# Patient Record
Sex: Male | Born: 1975 | Race: White | Hispanic: No | Smoking: Current every day smoker
Health system: Southern US, Community
[De-identification: ages and names within clinical notes are randomized; demographics above are authoritative.]

## PROBLEM LIST (undated history)

## (undated) DIAGNOSIS — F329 Major depressive disorder, single episode, unspecified: Secondary | ICD-10-CM

## (undated) DIAGNOSIS — J449 Chronic obstructive pulmonary disease, unspecified: Secondary | ICD-10-CM

## (undated) DIAGNOSIS — I10 Essential (primary) hypertension: Secondary | ICD-10-CM

## (undated) DIAGNOSIS — F419 Anxiety disorder, unspecified: Secondary | ICD-10-CM

## (undated) DIAGNOSIS — F32A Depression, unspecified: Secondary | ICD-10-CM

## (undated) HISTORY — DX: Chronic obstructive pulmonary disease, unspecified: J44.9

## (undated) HISTORY — DX: Major depressive disorder, single episode, unspecified: F32.9

## (undated) HISTORY — PX: HIP ARTHROSCOPY: SUR88

## (undated) HISTORY — DX: Anxiety disorder, unspecified: F41.9

## (undated) HISTORY — DX: Depression, unspecified: F32.A

## (undated) HISTORY — DX: Essential (primary) hypertension: I10

---

## 1998-05-02 ENCOUNTER — Encounter: Payer: Self-pay | Admitting: Emergency Medicine

## 1998-05-02 ENCOUNTER — Emergency Department (HOSPITAL_COMMUNITY): Admission: EM | Admit: 1998-05-02 | Discharge: 1998-05-02 | Payer: Self-pay | Admitting: Emergency Medicine

## 2000-04-13 ENCOUNTER — Emergency Department (HOSPITAL_COMMUNITY): Admission: EM | Admit: 2000-04-13 | Discharge: 2000-04-13 | Payer: Self-pay

## 2000-05-19 ENCOUNTER — Emergency Department (HOSPITAL_COMMUNITY): Admission: EM | Admit: 2000-05-19 | Discharge: 2000-05-19 | Payer: Self-pay | Admitting: Emergency Medicine

## 2000-05-19 ENCOUNTER — Encounter: Payer: Self-pay | Admitting: Emergency Medicine

## 2002-06-08 ENCOUNTER — Encounter: Payer: Self-pay | Admitting: Orthopedic Surgery

## 2002-06-08 ENCOUNTER — Ambulatory Visit (HOSPITAL_COMMUNITY): Admission: RE | Admit: 2002-06-08 | Discharge: 2002-06-08 | Payer: Self-pay | Admitting: Orthopedic Surgery

## 2002-08-15 ENCOUNTER — Ambulatory Visit (HOSPITAL_COMMUNITY): Admission: RE | Admit: 2002-08-15 | Discharge: 2002-08-15 | Payer: Self-pay | Admitting: Orthopedic Surgery

## 2002-08-15 ENCOUNTER — Encounter: Payer: Self-pay | Admitting: Orthopedic Surgery

## 2002-09-15 ENCOUNTER — Encounter: Admission: RE | Admit: 2002-09-15 | Discharge: 2002-11-28 | Payer: Self-pay | Admitting: Orthopedic Surgery

## 2002-12-27 ENCOUNTER — Ambulatory Visit (HOSPITAL_COMMUNITY): Admission: RE | Admit: 2002-12-27 | Discharge: 2002-12-27 | Payer: Self-pay | Admitting: Orthopedic Surgery

## 2003-03-28 ENCOUNTER — Encounter: Admission: RE | Admit: 2003-03-28 | Discharge: 2003-05-03 | Payer: Self-pay | Admitting: Orthopedic Surgery

## 2003-05-29 ENCOUNTER — Emergency Department (HOSPITAL_COMMUNITY): Admission: EM | Admit: 2003-05-29 | Discharge: 2003-05-29 | Payer: Self-pay | Admitting: Family Medicine

## 2004-05-28 ENCOUNTER — Ambulatory Visit: Payer: Self-pay | Admitting: Internal Medicine

## 2004-10-20 ENCOUNTER — Ambulatory Visit: Payer: Self-pay | Admitting: Internal Medicine

## 2005-01-07 ENCOUNTER — Emergency Department (HOSPITAL_COMMUNITY): Admission: EM | Admit: 2005-01-07 | Discharge: 2005-01-07 | Payer: Self-pay | Admitting: Family Medicine

## 2005-03-04 ENCOUNTER — Ambulatory Visit: Payer: Self-pay | Admitting: Family Medicine

## 2005-03-16 ENCOUNTER — Ambulatory Visit: Payer: Self-pay | Admitting: Internal Medicine

## 2005-03-31 ENCOUNTER — Ambulatory Visit: Payer: Self-pay | Admitting: Internal Medicine

## 2006-01-12 ENCOUNTER — Ambulatory Visit: Payer: Self-pay | Admitting: Family Medicine

## 2006-02-06 ENCOUNTER — Emergency Department (HOSPITAL_COMMUNITY): Admission: EM | Admit: 2006-02-06 | Discharge: 2006-02-06 | Payer: Self-pay | Admitting: Emergency Medicine

## 2006-03-09 ENCOUNTER — Ambulatory Visit: Payer: Self-pay | Admitting: Internal Medicine

## 2006-03-09 LAB — CONVERTED CEMR LAB
ALT: 29 units/L (ref 0–40)
Albumin: 4 g/dL (ref 3.5–5.2)
Alkaline Phosphatase: 47 units/L (ref 39–117)
Basophils Absolute: 0.1 10*3/uL (ref 0.0–0.1)
CO2: 31 meq/L (ref 19–32)
Chloride: 111 meq/L (ref 96–112)
Creatinine, Ser: 1 mg/dL (ref 0.4–1.5)
Eosinophils Absolute: 0.2 10*3/uL (ref 0.0–0.6)
GFR calc Af Amer: 112 mL/min
GFR calc non Af Amer: 93 mL/min
Glucose, Bld: 81 mg/dL (ref 70–99)
Hemoglobin: 15.1 g/dL (ref 13.0–17.0)
Phosphorus: 2.9 mg/dL (ref 2.3–4.6)
Potassium: 4.2 meq/L (ref 3.5–5.1)
Sodium: 145 meq/L (ref 135–145)
TSH: 1.12 microintl units/mL (ref 0.35–5.50)
Total Bilirubin: 0.9 mg/dL (ref 0.3–1.2)

## 2006-09-16 ENCOUNTER — Ambulatory Visit: Payer: Self-pay | Admitting: Internal Medicine

## 2006-09-16 DIAGNOSIS — M7500 Adhesive capsulitis of unspecified shoulder: Secondary | ICD-10-CM | POA: Insufficient documentation

## 2006-09-16 DIAGNOSIS — F419 Anxiety disorder, unspecified: Secondary | ICD-10-CM | POA: Insufficient documentation

## 2006-09-23 ENCOUNTER — Encounter: Payer: Self-pay | Admitting: Internal Medicine

## 2006-11-08 ENCOUNTER — Telehealth (INDEPENDENT_AMBULATORY_CARE_PROVIDER_SITE_OTHER): Payer: Self-pay | Admitting: *Deleted

## 2006-12-22 ENCOUNTER — Telehealth (INDEPENDENT_AMBULATORY_CARE_PROVIDER_SITE_OTHER): Payer: Self-pay | Admitting: *Deleted

## 2006-12-22 ENCOUNTER — Telehealth: Payer: Self-pay | Admitting: Internal Medicine

## 2007-02-03 HISTORY — PX: JOINT REPLACEMENT: SHX530

## 2007-03-04 ENCOUNTER — Inpatient Hospital Stay (HOSPITAL_COMMUNITY): Admission: RE | Admit: 2007-03-04 | Discharge: 2007-03-07 | Payer: Self-pay | Admitting: Orthopedic Surgery

## 2007-03-07 ENCOUNTER — Encounter (INDEPENDENT_AMBULATORY_CARE_PROVIDER_SITE_OTHER): Payer: Self-pay | Admitting: Internal Medicine

## 2007-04-06 ENCOUNTER — Telehealth (INDEPENDENT_AMBULATORY_CARE_PROVIDER_SITE_OTHER): Payer: Self-pay | Admitting: *Deleted

## 2007-05-16 ENCOUNTER — Emergency Department (HOSPITAL_COMMUNITY): Admission: EM | Admit: 2007-05-16 | Discharge: 2007-05-16 | Payer: Self-pay | Admitting: Emergency Medicine

## 2007-05-23 ENCOUNTER — Encounter (INDEPENDENT_AMBULATORY_CARE_PROVIDER_SITE_OTHER): Payer: Self-pay | Admitting: Internal Medicine

## 2007-09-17 ENCOUNTER — Emergency Department (HOSPITAL_COMMUNITY): Admission: EM | Admit: 2007-09-17 | Discharge: 2007-09-17 | Payer: Self-pay | Admitting: Family Medicine

## 2007-10-19 ENCOUNTER — Emergency Department (HOSPITAL_COMMUNITY): Admission: EM | Admit: 2007-10-19 | Discharge: 2007-10-19 | Payer: Self-pay | Admitting: Emergency Medicine

## 2007-11-08 ENCOUNTER — Ambulatory Visit (HOSPITAL_COMMUNITY): Admission: RE | Admit: 2007-11-08 | Discharge: 2007-11-08 | Payer: Self-pay | Admitting: Orthopedic Surgery

## 2007-12-04 ENCOUNTER — Emergency Department (HOSPITAL_COMMUNITY): Admission: EM | Admit: 2007-12-04 | Discharge: 2007-12-05 | Payer: Self-pay | Admitting: Emergency Medicine

## 2007-12-08 ENCOUNTER — Encounter: Admission: RE | Admit: 2007-12-08 | Discharge: 2008-02-02 | Payer: Self-pay | Admitting: Orthopedic Surgery

## 2009-02-02 HISTORY — PX: OTHER SURGICAL HISTORY: SHX169

## 2009-04-15 ENCOUNTER — Emergency Department (HOSPITAL_COMMUNITY): Admission: EM | Admit: 2009-04-15 | Discharge: 2009-04-15 | Payer: Self-pay | Admitting: Emergency Medicine

## 2009-05-10 ENCOUNTER — Ambulatory Visit: Payer: Self-pay | Admitting: Internal Medicine

## 2009-05-30 ENCOUNTER — Encounter (INDEPENDENT_AMBULATORY_CARE_PROVIDER_SITE_OTHER): Payer: Self-pay | Admitting: *Deleted

## 2009-06-10 ENCOUNTER — Encounter: Payer: Self-pay | Admitting: Internal Medicine

## 2009-06-10 ENCOUNTER — Ambulatory Visit: Payer: Self-pay | Admitting: Internal Medicine

## 2009-06-12 LAB — CONVERTED CEMR LAB
Barbiturate Quant, Ur: NEGATIVE
Cocaine Metabolites: POSITIVE — AB
Methadone: NEGATIVE
Opiate Screen, Urine: NEGATIVE
Phencyclidine (PCP): NEGATIVE
Propoxyphene: NEGATIVE

## 2009-07-17 ENCOUNTER — Encounter (INDEPENDENT_AMBULATORY_CARE_PROVIDER_SITE_OTHER): Payer: Self-pay | Admitting: Family Medicine

## 2009-07-17 ENCOUNTER — Ambulatory Visit: Payer: Self-pay | Admitting: Internal Medicine

## 2009-07-17 LAB — CONVERTED CEMR LAB
HDL: 55 mg/dL (ref >39)
Hemoglobin: 16.7 g/dL (ref 13.0–17.0)
LDL Cholesterol: 157 mg/dL — ABNORMAL HIGH (ref 0–99)
Lymphs Abs: 2.4 10*3/uL (ref 0.7–4.0)
MCV: 92.6 fL (ref 78.0–100.0)
Monocytes Absolute: 0.4 10*3/uL (ref 0.1–1.0)
Platelets: 235 10*3/uL (ref 150–400)
RBC: 5.3 M/uL (ref 4.22–5.81)
RDW: 13.3 % (ref 11.5–15.5)
Total Bilirubin: 0.7 mg/dL (ref 0.3–1.2)
Total CHOL/HDL Ratio: 4.5
Total Protein: 7.7 g/dL (ref 6.0–8.3)
Triglycerides: 172 mg/dL — ABNORMAL HIGH (ref <150)

## 2009-10-09 ENCOUNTER — Ambulatory Visit: Payer: Self-pay | Admitting: Internal Medicine

## 2010-01-02 ENCOUNTER — Emergency Department (HOSPITAL_COMMUNITY)
Admission: EM | Admit: 2010-01-02 | Discharge: 2010-01-02 | Payer: Self-pay | Source: Home / Self Care | Admitting: Emergency Medicine

## 2010-02-12 ENCOUNTER — Encounter: Admission: RE | Admit: 2010-02-12 | Discharge: 2010-03-04 | Payer: Self-pay | Source: Home / Self Care

## 2010-03-03 ENCOUNTER — Emergency Department (HOSPITAL_COMMUNITY)
Admission: EM | Admit: 2010-03-03 | Discharge: 2010-03-03 | Payer: Self-pay | Source: Home / Self Care | Admitting: Family Medicine

## 2010-03-04 NOTE — Letter (Signed)
Summary: Generic Letter  [facility]  [address]   [place], [place] [address]   Phone: [phone]  Fax: [phone]       05/30/2009  [name] [address] [place], [place]  [address]  To Whom it may concern,  [name] has a history of chronic right hip pain; he had a total hip replacement 2009. He is currently taking pain medication to manage his pain. We plan to refer him to an Orthopedic Surgeon at [facility] for evaluation.              Sincerely,   Jessie Foot

## 2010-03-06 ENCOUNTER — Ambulatory Visit: Payer: Self-pay | Admitting: Physical Therapy

## 2010-03-10 ENCOUNTER — Ambulatory Visit: Payer: Self-pay | Attending: Internal Medicine

## 2010-03-10 DIAGNOSIS — IMO0001 Reserved for inherently not codable concepts without codable children: Secondary | ICD-10-CM | POA: Insufficient documentation

## 2010-03-10 DIAGNOSIS — M25659 Stiffness of unspecified hip, not elsewhere classified: Secondary | ICD-10-CM | POA: Insufficient documentation

## 2010-03-10 DIAGNOSIS — M25559 Pain in unspecified hip: Secondary | ICD-10-CM | POA: Insufficient documentation

## 2010-03-10 DIAGNOSIS — R5381 Other malaise: Secondary | ICD-10-CM | POA: Insufficient documentation

## 2010-03-12 ENCOUNTER — Ambulatory Visit: Payer: Self-pay

## 2010-06-02 ENCOUNTER — Inpatient Hospital Stay (INDEPENDENT_AMBULATORY_CARE_PROVIDER_SITE_OTHER)
Admission: RE | Admit: 2010-06-02 | Discharge: 2010-06-02 | Disposition: A | Discharge status: Home / Self Care | Payer: Self-pay | Source: Ambulatory Visit | Attending: Family Medicine | Admitting: Family Medicine

## 2010-06-02 ENCOUNTER — Ambulatory Visit (INDEPENDENT_AMBULATORY_CARE_PROVIDER_SITE_OTHER): Payer: Self-pay

## 2010-06-02 DIAGNOSIS — S7010XA Contusion of unspecified thigh, initial encounter: Secondary | ICD-10-CM

## 2010-06-17 NOTE — H&P (Signed)
[name], [name]              ACCOUNT NO.:  [id number]   MEDICAL RECORD NO.:  [id number]          PATIENT TYPE:  INP   LOCATION:  1603                         FACILITY:  [facility]   PHYSICIAN:  Ollen Gross, M.D.    DATE OF BIRTH:  [birth year]   DATE OF ADMISSION:  03/04/2007  DATE OF DISCHARGE:                              HISTORY & PHYSICAL   NOTE:  Date of office visit history and physical March 01, 2007.  Date  of admission March 04, 2007.   CHIEF COMPLAINT:  Right hip pain.   HISTORY OF PRESENT ILLNESS:  The patient is a 35 year old male well  known to Dr. Ollen Gross having been followed for quite some time now  for right hip pain.  He has had intermittent treatment for his right  hip.  He has undergone two hip arthroscopies in the past.  Unfortunately, he has gone on to develop some significant bone-on-bone  arthritis especially in the superior lateral area, continues to have  progressive worsening pain and dysfunction.  The risks and benefits have  been discussed.  He elects to proceed with surgery.   ALLERGIES:  No known drug allergies.   CURRENT MEDICATIONS:  Oxycodone.   PAST MEDICAL HISTORY:  Anxiety, history of bronchitis and arthritis.   PAST SURGICAL HISTORY:  Right hip arthroscopy x2.   FAMILY HISTORY:  Noncontributory.   SOCIAL HISTORY:  Married, an Horticulturist, commercial.  Smoking history one to  two packs a day for about 17 years.  Denies use of alcohol.  Four  children.   REVIEW OF SYSTEMS:  GENERAL:  No fevers, chills, night sweats.  NEUROLOGICAL:  No seizures, syncope or paralysis.  RESPIRATORY:  No  shortness breath, productive cough or hemoptysis.  CARDIOVASCULAR:  No  chest pain, angina or orthopnea.  GASTROINTESTINAL:  Does have some  heartburn reflux with constipation.  GU: No dysuria or hematuria.  MUSCULOSKELETAL:  Back pain and hip pain.   PHYSICAL EXAMINATION:  VITAL SIGNS:  Pulse 68, respirations 12, blood  pressure 110/72.  GENERAL:  A  35 year old white male well-nourished, well-developed, in no  acute distress, mildly anxious, alert and oriented and cooperative.  HEENT:  Normocephalic, atraumatic.  Pupils round and reactive.  Oropharynx clear.  EOMs intact.  NECK:  Supple.  CHEST:  Clear.  HEART:  Regular rate and rhythm with faint early systolic ejection  murmur best heard over the aortic point.  S1-S2 noted.  ABDOMEN:  Soft, nontender.  Bowel sounds present.  BREASTS/GENITALIA/RECTAL:  Not done, not pertinent to present illness.  EXTREMITIES:  Right hip flexion 90, zero internal rotation, 10-50  degrees external rotation, 20 degrees abduction.   IMPRESSION:  Osteoarthritis, right hip.   PLAN:  The patient was admitted to [facility] to undergo a  right total hip replacement arthroplasty.  Surgery will be performed by  Dr. Ollen Gross.      [name], P.A.C.      Ollen Gross, M.D.  Electronically Signed    ALP/MEDQ  D:  03/05/2007  T:  03/06/2007  Job:  [id number]   cc:  Billie D. Jillyn Hidden, FNP  [address] [place], [place] [address]   Ollen Gross, M.D.  Fax: [phone]

## 2010-06-17 NOTE — Op Note (Signed)
[name], [name]              ACCOUNT NO.:  [id number]   MEDICAL RECORD NO.:  [id number]          PATIENT TYPE:  INP   LOCATION:  0001                         FACILITY:  [facility]   PHYSICIAN:  Ollen Gross, M.D.    DATE OF BIRTH:  05-09-1975   DATE OF PROCEDURE:  03/04/2007  DATE OF DISCHARGE:                               OPERATIVE REPORT   PREOPERATIVE DIAGNOSIS:  Osteoarthritis right hip.   POSTOPERATIVE DIAGNOSIS:  Osteoarthritis right hip.   PROCEDURE:  Right total hip arthroplasty.   SURGEON:  Ollen Gross, M.D.   ASSISTANT:  Alexzandrew L. Perkins, P.A.C.   ANESTHESIA:  General.   ESTIMATED BLOOD LOSS:  Two hundred.   DRAINS:  Hemovac times one.   COMPLICATIONS:  None.   CONDITION:  Stable to recovery.   BRIEF CLINICAL NOTE:  [name] is a 35 year old male who has had  progressively worsening right hip pain and dysfunction.  He has had a  previous arthroscopy but over the past couple of years has gone on to  develop severe degenerative change in his hip.  He has had extensive  nonoperative management and presents now for total hip arthroplasty.   PROCEDURE IN DETAIL:  After the successful administration of general  anesthetic, the patient was placed in the left lateral decubitus  position with the right side up and held with the hip positioner.  Right  lower extremity was isolated from his perineum with plastic drapes and  prepped and draped in the usual sterile fashion.  Short posterolateral  incision was made with a 10 blade through subcutaneous tissue to the  level of the fascia lata which was incised in line with the skin  incision.  The sciatic nerve was palpated and protected and the short  external rotators isolated off the femur.  Capsulectomy is performed and  the hip is dislocated.  The center of the femoral head is marked and a  trial prosthesis placed such that the center of the trial head  corresponds to the center of his native femoral head.   Osteotomy lines  were marked on the femoral neck and osteotomy made with an oscillating  saw.  Femoral head was removed and the femur retracted anteriorly to  gain acetabular exposure.   The acetabular retractors were placed and labrum and osteophytes  removed.  Acetabular reaming starts at 45 mm with coursing increments of  2-53 mm and a 54 mm Pinnacle acetabular shell was placed in anatomic  position and transfixed with two dome screws.  The apex hole eliminator  was placed and the permanent 36 mm neutral Ultamet metal liner is  placed.   On the femoral side we prepared the canal with the canal finder and  irrigation.  Axial reaming was performed to 15.5 mm, proximal reaming to  a 20 B and the sleeve machined to a large 20 B large trial sleeve is  placed with a 20 x 15 stem and 36 plus 8 neck matching native  anteversion.  The 36 plus zero head is placed.  The hip was reduced with  excellent stability.  There was  full extension, full internal rotation,  70 degrees flexion, 40 degrees adduction, 90 degrees internal rotation,  90 degrees flexion and 70 degrees of internal rotation.  By placing the  right leg on top of the left it felt as though the leg lengths were  equal.  The hip was then dislocated and all trials removed.  The  permanent 20 B large sleeve is placed with a 20 x 15 stem, 36 plus 8  neck matching native anteversion.  The 36 plus zero head is placed.  The  hip was reduced with the same stability parameters.  The wound was  copiously irrigated with saline solution and the short rotators  reattached to the femur through drill holes.  Fascia lata was closed  over Hemovac drain with interrupted #1 Vicryl, subcu closed with #1 and  2-0 Vicryl and subcuticular with running 4-0 Monocryl.  The incisions  cleaned and dried and Steri-Strips applied.  Drains hooked to suction  and a bulky sterile dressing was applied.  He was then placed into a  knee immobilizer, awakened and  transported to recovery in stable  condition.      Ollen Gross, M.D.  Electronically Signed     FA/MEDQ  D:  03/04/2007  T:  03/04/2007  Job:  [id number]

## 2010-06-18 ENCOUNTER — Inpatient Hospital Stay (INDEPENDENT_AMBULATORY_CARE_PROVIDER_SITE_OTHER)
Admission: RE | Admit: 2010-06-18 | Discharge: 2010-06-18 | Disposition: A | Discharge status: Home / Self Care | Payer: Self-pay | Source: Ambulatory Visit | Attending: Family Medicine | Admitting: Family Medicine

## 2010-06-18 DIAGNOSIS — F410 Panic disorder [episodic paroxysmal anxiety] without agoraphobia: Secondary | ICD-10-CM

## 2010-06-20 NOTE — Discharge Summary (Signed)
[name], [name]              ACCOUNT NO.:  [id number]   MEDICAL RECORD NO.:  [id number]          PATIENT TYPE:  INP   LOCATION:  1603                         FACILITY:  [facility]   PHYSICIAN:  Ollen Gross, M.D.    DATE OF BIRTH:  December 11, 1975   DATE OF ADMISSION:  03/04/2007  DATE OF DISCHARGE:  03/07/2007                               DISCHARGE SUMMARY   ADMISSION DIAGNOSES:  1. Osteoarthritis right hip.  2. Anxiety.  3. History of bronchitis.   DISCHARGE DIAGNOSES:  1. Osteoarthritis right hip, status post right total hip arthroplasty.  2. Anxiety.  3. History of bronchitis.   PROCEDURE:  March 04, 2007, right total hip.  Surgeon:  Dr. Lequita Halt.  Assistant:  Greggory Brandy.   ANESTHESIA:  General.   CONSULTATIONS:  None.   BRIEF HISTORY:  [name] is a 35 year old male with progressive worsening  pain and dysfunction with the right hip.  He had a previous scope a  couple of years ago and had gone on to develop severe degenerative  changes, extensive nonoperative/postoperative management and now  presents for a total hip arthroplasty.   LABORATORY:  Preop CBC showed hemoglobin 15.0, hematocrit 41.5, white  cell count 6.0, platelets 222, postop hemoglobin 13.0 and drifted down.  Last H&H 11.9 and 33.3.  PT/PTT preop 12.0 and 32 respectively.  INR  0.9.  Serial pro-times followed.  PT/INR 21.6 and 1.8  Chem panel on  admission all within normal limits.  Serial BMETs are followed.  Electrolytes remained within normal limits.  Preop UA negative.  Blood  group type A+.   DIAGNOSTICS:  1. Two-view chest March 01, 2007:  No active cardiopulmonary      disease, mild chronic bronchitic markings.  2. Right hip films March 01, 2007:  Right total hip replacement      without complication.   HOSPITAL COURSE:  The patient admitted to [facility] and  tolerated the procedure well, later transferred to the recovery room and  orthopedic floor.  Started on PCA and p.o.  analgesic for pain control  following surgery.  He did have some pain noted postoperatively, but  otherwise doing pretty well.  Encouraged p.o. medications.  Drain placed  at the time of surgery, the Hemovac was pulled on day 1 without  difficulty.  Started getting up out of bed by day 2 and he was doing a  little bit better.  Hemoglobin was stable.  INR was coming up.  Started  working with physical therapy walking about 200 feet, progressing well  and by day 3 had been weaned over to p.o. meds and weaned off his PCA.  Tolerating his pain medications, meeting his goals and was discharged  home.   DISPOSITION:  The patient was discharged home on March 07, 2007.   DISCHARGE MEDICATIONS:  Percocet, Valium and Coumadin.   DIET:  As tolerated.   ACTIVITY:  Partial weightbearing 25-50% right lower extremity.  Home  health PT and home health nursing.  Total hip protocol and hip  precautions.   FOLLOW UP:  Follow up in 2 weeks.  CONDITION ON DISCHARGE:  Improved.      [name], P.A.C.      Ollen Gross, M.D.  Electronically Signed    ALP/MEDQ  D:  04/12/2007  T:  04/13/2007  Job:  [id number]   cc:   Ollen Gross, M.D.  Fax: [phone]   Billie D. Jillyn Hidden, FNP  [address] [place], [place] [address]

## 2010-06-20 NOTE — Assessment & Plan Note (Signed)
[facility]                                 ON-CALL NOTE   DIJUAN, SLEETH                     MRN:          [id number]  DATE:01/23/2006                            DOB:          1975-04-03    CALLER:  Patient's wife.   TELEPHONE NUMBER:  [phone].   He pulled something lifting something heavy on Tuesday at work. He  started having pain on Wednesday. He thought it would kind of get  better. He kept going to work and it has now gotten worse. He has  trouble lifting his arm and moving it around. He has not really tried  any medication. His wife has massaged him some, but that actually caused  some more pain. She thinks the pain is really like over the deltoid  muscle by the shoulder.   PLAN:  This does not seem like something serious or emergent. I did  recommend heat and ibuprofen. He has 800 mg prescription to ibuprofen to  try. I told her to have him take them 3x a day with meals. If he is not  getting better by Monday, I can take a look at it in the office and make  further decisions at that point.     Karie Schwalbe, MD  Electronically Signed    RIL/MedQ  DD: 01/23/2006  DT: 01/24/2006  Job #: [id number]

## 2010-06-20 NOTE — Op Note (Signed)
NAME:  Wagner, Henry                        ACCOUNT NO.:  [id number]   MEDICAL RECORD NO.:  [id number]                   PATIENT TYPE:  OIB   LOCATION:  2899                                 FACILITY:  [facility]   PHYSICIAN:  Ollen Gross, M.D.                 DATE OF BIRTH:  May 04, 1975   DATE OF PROCEDURE:  12/27/2002  DATE OF DISCHARGE:  12/27/2002                                 OPERATIVE REPORT   PREOPERATIVE DIAGNOSES:  Right hip recurrent labral tear.   POSTOPERATIVE DIAGNOSIS:  Right hip recurrent labral tear.   PROCEDURE:  Right hip arthroscopy and debridement.   SURGEON:  Ollen Gross, M.D.   ASSISTANT:  Alexzandrew L. Julien Girt, P.A.   ANESTHESIA:  General.   ESTIMATED BLOOD LOSS:  Minimal.   DRAINS:  None.   COMPLICATIONS:  None.   CONDITION:  Stable to recovery.   BRIEF CLINICAL NOTE:  [name] is a 35 year old male who had a right hip  arthroscopy in July 2004.  He had labral tear and chondral defect at that  time.  After physical therapy he had recovered relatively well, but then has  had recurrent pain and mechanical symptoms similar to his initial injury.  He presents now for repeat arthroscopy and debridement.   PROCEDURE IN DETAIL:  After the successful administration of general  anesthetic, the patient is placed in the left lateral decubitus position  with the right side up, with his lower extremities well padded.  A peroneal  post is placed and his right foot well padded and is placed into a traction  boot.  Longitudinal traction is applied under fluoroscopic guidance.  Again,  appropriate distraction to the hip joint.  We then prepped and draped the  hip in the usual sterile fashion.  A spinal needle was placed to the  anterior and posterior peritrochanteric portals, and found to enter the  joint.  Then 30 cc of saline is injected through the anterior portal, and  shown to come out through the posterior portal -- confirming that they are  both  intra-articular.  Incision is made posteriorly.  The Mittenhall wire is  passed and needle removed.  Portal was established with the dilator, and  then a 5 mm cannula placed.  Arthroscopic visualization proceeds.   The joint shows that there is site synovitis anteriorly.  The chondral  surface of the acetabulum looks intact, with no chondral defects.  Anteriorly the labrum is retorn at the anterior and inferior position.  The  femoral head does show some grade 2 and 3 chondromalacia centrally.  There  is no focal full-thickness chondral defect.  The wire was passed through the  anterior needle; incision made and dilator placed and the anterior portal  established.  A combination of a shaver and a Arthro-Care wand are used to  debride the labrum back to a stable place.  There is no associated chondral  defect.  The rest of the joint is again inspected.  There is no other  pathology noted.   At this point, the arthroscopic equipment is removed after injection of 20  cc of 0.5% Marcaine through the inflow cannula.  That cannula is removed and  the traction released.  Fluoroscopic shot shows concentric reduction of the  hip.  The portals are then closed with interrupted 4-0 nylon.  Bulky sterile  dressing is applied.   He was placed into hospital bed, awakened and transported to recovery room  in stable condition.   Of note, we were able to achieve full extension of his hip.  Preoperatively  he complained a flexor contracture, that has been present for several years.                                               Ollen Gross, M.D.    FA/MEDQ  D:  12/27/2002  T:  12/28/2002  Job:  [id number]

## 2010-06-20 NOTE — Op Note (Signed)
NAME:  [name], [name]                        ACCOUNT NO.:  [id number]   MEDICAL RECORD NO.:  [id number]                   PATIENT TYPE:  OIB   LOCATION:  NA                                   FACILITY:  [facility]   PHYSICIAN:  Ollen Gross, M.D.                 DATE OF BIRTH:  September 07, 1975   DATE OF PROCEDURE:  08/15/2002  DATE OF DISCHARGE:                                 OPERATIVE REPORT   PREOPERATIVE DIAGNOSIS:  Right hip labral tear.   POSTOPERATIVE DIAGNOSIS:  Right hip labral tear.   OPERATION PERFORMED:  Right hip arthroscopy, labral debridement and  chondroplasty.   SURGEON:  Ollen Gross, M.D.   ASSISTANT:  Alexzandrew L. Julien Girt, P.A.   ANESTHESIA:  General.   ESTIMATED BLOOD LOSS:  Minimal.   DRAINS:  None.   COMPLICATIONS:  None.   DISPOSITION:  Condition stable to recovery.   INDICATIONS FOR PROCEDURE:  Kaymen is a 35 year old male who has had a  several year history of progressively worsening right hip pain and  mechanical symptoms.  He has also had some low back pain.  Exam and history  area consistent with labral tear. He presents now for arthroscopy with  debridement.   DESCRIPTION OF PROCEDURE:  After the successful administration of general  anesthetic, the patient was placed in left lateral decubitus position and  his lower leg was well padded.  The perineal post in the lateral position  was placed and the right leg draped over it with the post well padded.  He  was placed in a well-padded traction boot and then under fluoroscopic  guidance, traction was applied until the joint was adequately distracted.  Once distracted, the hip was prepped and draped in the usual sterile  fashion. Spinal needle was used to localize the anterior and posterior  peritrochanteric portals.  Once established we injected saline through the  posterior portal and it came out through the anterior portal.  Nitinol wire  was placed through the posterior needle.  Needle removed  and incision made  and dilator was placed. A 5 mm cannula was placed and the camera passed into  the joint.  Once confirmed to be intra-articular, inflow was initiated.   Arthroscopic visualization shows the entire posterior and superior aspect of  the joint to appear normal.  Femoral head shows chondromalacia anteriorly  but no focal defects.  The anterior aspect of the acetabulum shows a large  anterior, inferior labral tear which goes from the 5 o'clock position to the  2 o'clock position. There was an associated anterior chondral defect.  The  rest of the anterior half looks normal.  The needle was found to be in good  position.  Thus the wire was passed and the anterior portal established.  We  then debrided the labral tear back to a stable base with the 4.2 shaver and  the ArthroCare device.  Chondroplasty  was then performed with the shaver and  arthro Gator.  Everything was found to be stable at this point.  The  arthroscopic equipment was then removed from the anterior portal and 20mL of  0.5% Marcaine with epinephrine injected through the inflow cannula.  Cannulas were removed and traction was taken off the joint and fluoroscopic  views confirmed the eccentric reduction.  We then closed the incisions with  interrupted 4-0 nylon, placed a bulky sterile dressing and the patient was  awakened and transported to recovery in stable condition.                                                Ollen Gross, M.D.    FA/MEDQ  D:  08/15/2002  T:  08/15/2002  Job:  [id number]

## 2010-06-26 ENCOUNTER — Emergency Department (HOSPITAL_COMMUNITY): Payer: Medicaid Other

## 2010-06-26 ENCOUNTER — Emergency Department (HOSPITAL_COMMUNITY)
Admission: EM | Admit: 2010-06-26 | Discharge: 2010-06-26 | Disposition: A | Discharge status: Home / Self Care | Payer: Medicaid Other | Attending: Emergency Medicine | Admitting: Emergency Medicine

## 2010-06-26 DIAGNOSIS — M79609 Pain in unspecified limb: Secondary | ICD-10-CM | POA: Insufficient documentation

## 2010-06-26 DIAGNOSIS — S92919A Unspecified fracture of unspecified toe(s), initial encounter for closed fracture: Secondary | ICD-10-CM | POA: Insufficient documentation

## 2010-06-26 DIAGNOSIS — M545 Low back pain, unspecified: Secondary | ICD-10-CM | POA: Insufficient documentation

## 2010-06-26 DIAGNOSIS — S8000XA Contusion of unspecified knee, initial encounter: Secondary | ICD-10-CM | POA: Insufficient documentation

## 2010-06-26 DIAGNOSIS — IMO0002 Reserved for concepts with insufficient information to code with codable children: Secondary | ICD-10-CM | POA: Insufficient documentation

## 2010-06-26 DIAGNOSIS — F411 Generalized anxiety disorder: Secondary | ICD-10-CM | POA: Insufficient documentation

## 2010-06-26 DIAGNOSIS — S63509A Unspecified sprain of unspecified wrist, initial encounter: Secondary | ICD-10-CM | POA: Insufficient documentation

## 2010-10-01 ENCOUNTER — Ambulatory Visit (INDEPENDENT_AMBULATORY_CARE_PROVIDER_SITE_OTHER): Payer: Self-pay

## 2010-10-01 ENCOUNTER — Inpatient Hospital Stay (INDEPENDENT_AMBULATORY_CARE_PROVIDER_SITE_OTHER)
Admission: RE | Admit: 2010-10-01 | Discharge: 2010-10-01 | Disposition: A | Discharge status: Home / Self Care | Payer: Self-pay | Source: Ambulatory Visit | Attending: Family Medicine | Admitting: Family Medicine

## 2010-10-01 DIAGNOSIS — S60459A Superficial foreign body of unspecified finger, initial encounter: Secondary | ICD-10-CM

## 2010-10-24 LAB — URINALYSIS, ROUTINE W REFLEX MICROSCOPIC
Ketones, ur: NEGATIVE
Nitrite: NEGATIVE

## 2010-10-24 LAB — CBC
HCT: 38.2 — ABNORMAL LOW
Hemoglobin: 13.7
Hemoglobin: 15
MCHC: 35.8
MCHC: 35.2
MCHC: 35.7
MCV: 88.8
Platelets: 210
Platelets: 222
Platelets: 199
Platelets: 224
RBC: 4.7
RBC: 4.15 — ABNORMAL LOW
RDW: 11.8
RDW: 11.9
RDW: 11.6
RDW: 12
WBC: 6.6

## 2010-10-24 LAB — COMPREHENSIVE METABOLIC PANEL
ALT: 25
AST: 20
Albumin: 4.2
Alkaline Phosphatase: 72
BUN: 6
CO2: 31
Calcium: 9.8
Chloride: 99
Creatinine, Ser: 0.87
GFR calc Af Amer: >60
GFR calc non Af Amer: >60
Glucose, Bld: 94
Potassium: 4.1
Sodium: 138
Total Bilirubin: 0.8
Total Protein: 7.1

## 2010-10-24 LAB — BASIC METABOLIC PANEL
BUN: 7
BUN: 4 — ABNORMAL LOW
CO2: 28
CO2: 28
Calcium: 8.9
Chloride: 102
Chloride: 102
Creatinine, Ser: 0.96
Creatinine, Ser: 0.83
GFR calc Af Amer: >60
GFR calc Af Amer: >60

## 2010-10-24 LAB — PROTIME-INR
INR: 0.9
INR: 1.6 — ABNORMAL HIGH
INR: 1.8 — ABNORMAL HIGH
Prothrombin Time: 12
Prothrombin Time: 19.5 — ABNORMAL HIGH
Prothrombin Time: 21.6 — ABNORMAL HIGH

## 2010-10-24 LAB — TYPE AND SCREEN
ABO/RH(D): A POS
Antibody Screen: NEGATIVE

## 2010-10-24 LAB — ABO/RH: ABO/RH(D): A POS

## 2011-02-01 ENCOUNTER — Emergency Department: Payer: Self-pay | Admitting: *Deleted

## 2012-01-21 ENCOUNTER — Encounter (HOSPITAL_COMMUNITY): Payer: Self-pay | Admitting: Emergency Medicine

## 2012-01-21 ENCOUNTER — Other Ambulatory Visit: Payer: Self-pay

## 2012-01-21 ENCOUNTER — Emergency Department (INDEPENDENT_AMBULATORY_CARE_PROVIDER_SITE_OTHER)
Admission: EM | Admit: 2012-01-21 | Discharge: 2012-01-21 | Disposition: A | Discharge status: Home / Self Care | Payer: Medicare Other | Source: Home / Self Care

## 2012-01-21 DIAGNOSIS — R0789 Other chest pain: Secondary | ICD-10-CM

## 2012-01-21 DIAGNOSIS — R079 Chest pain, unspecified: Secondary | ICD-10-CM

## 2012-01-21 DIAGNOSIS — R071 Chest pain on breathing: Secondary | ICD-10-CM

## 2012-01-21 DIAGNOSIS — R0781 Pleurodynia: Secondary | ICD-10-CM

## 2012-01-21 MED ORDER — KETOROLAC TROMETHAMINE 60 MG/2ML IM SOLN
INTRAMUSCULAR | Status: AC
  Filled 2012-01-21: qty 2

## 2012-01-21 MED ORDER — KETOROLAC TROMETHAMINE 60 MG/2ML IM SOLN
60.0000 mg | Freq: Once | INTRAMUSCULAR | Status: AC
  Administered 2012-01-21: 60 mg via INTRAMUSCULAR

## 2012-01-21 MED ORDER — NAPROXEN 500 MG PO TBEC: 500.0000 mg | DELAYED_RELEASE_TABLET | Freq: Two times a day (BID) | ORAL | Status: DC

## 2012-01-21 MED ORDER — HYDROCODONE-ACETAMINOPHEN 5-325 MG PO TABS: 1.0000 | ORAL_TABLET | ORAL | Status: DC | PRN

## 2012-01-21 NOTE — ED Provider Notes (Signed)
History     CSN: [id number]  Arrival date & time 01/21/12  1049   None     Chief Complaint  Patient presents with  . Chest Pain    (Consider location/radiation/quality/duration/timing/severity/associated sxs/prior treatment) HPI Comments: 36 year old male patient presents with right chest pain for 2 days. Just prior to the onset of pain he had been helping a friend move so he had been lifting boxes and furniture. He felt pain during that time it it has been progressively getting worse. It is worse with movement and certain positions and taking a deep breath. Pain is located in the right antero lateral ribs. He has no cough, fever or shortness of breath. He denies any trauma especially blunt trauma.   History reviewed. No pertinent past medical history.  No past surgical history on file.  No family history on file.  History  Substance Use Topics  . Smoking status: Not on file  . Smokeless tobacco: Not on file  . Alcohol Use: Not on file      Review of Systems  Respiratory: Negative.   Cardiovascular: Positive for chest pain.  Gastrointestinal: Negative.   Neurological: Negative.   All other systems reviewed and are negative.    Allergies  Fluoxetine hcl and Sertraline hcl  Home Medications   Current Outpatient Rx  Name  Route  Sig  Dispense  Refill  . HYDROCODONE-ACETAMINOPHEN 5-325 MG PO TABS   Oral   Take 1 tablet by mouth every 4 (four) hours as needed for pain.   15 tablet   0   . NAPROXEN 500 MG PO TBEC   Oral   Take 1 tablet (500 mg total) by mouth 2 (two) times daily with a meal.   20 tablet   0     BP 149/100  Pulse 79  Temp 98 F (36.7 C) (Oral)  Resp 18  SpO2 98%  Physical Exam  Constitutional: He is oriented to person, place, and time. He appears well-developed and well-nourished.  HENT:  Head: Normocephalic and atraumatic.  Eyes: EOM are normal. Left eye exhibits no discharge.  Neck: Normal range of motion. Neck supple.   Pulmonary/Chest: Effort normal. No respiratory distress. He has no wheezes. He has no rales.  Abdominal: Soft. He exhibits no distension. There is no tenderness. There is no rebound and no guarding.  Musculoskeletal:       Marked tenderness in the right anterolateral ribs just inferior to the pectoralis major muscle no crackling or immobility of ribs are appreciated. No shoulder pain or tenderness no pain or tenderness of the sternum.  Neurological: He is alert and oriented to person, place, and time. No cranial nerve deficit.  Skin: Skin is warm and dry.  Psychiatric: He has a normal mood and affect.    ED Course  Procedures (including critical care time)  Labs Reviewed - No data to display No results found.   1. Chest wall pain   2. Rib pain on right side       MDM  Patient is stable and having chest wall pain associated with marked tenderness and pain with movement. He has no cardiac history and no associated cardio pulmonary symptoms. Toradol 60 mg IM now Naprosy EC 500 mg twice a day when necessary Norco 5 mg one every 4 hours when necessary pain #15 Ice packs to sore areas for the next 23 days and may apply heat. Check probably for any new symptoms especially shortness of breath, cough or fever.  Hayden Rasmussen, NP 01/21/12 1421

## 2012-01-21 NOTE — ED Provider Notes (Signed)
Medical screening examination/treatment/procedure(s) were performed by non-physician practitioner and as supervising physician I was immediately available for consultation/collaboration.   MORENO-COLL,Haeleigh Streiff; MD   Dyland Panuco Moreno-Coll, MD 01/21/12 1512 

## 2012-01-21 NOTE — ED Notes (Signed)
Reports chest pain on right side by breast.  Patient states pain started Tuesday night but has gotten worst.  Patient has tried ibuprofen but no relief.  No radiation to any arm pain.

## 2012-02-08 ENCOUNTER — Ambulatory Visit (INDEPENDENT_AMBULATORY_CARE_PROVIDER_SITE_OTHER): Payer: Medicare Other | Admitting: Internal Medicine

## 2012-02-08 ENCOUNTER — Encounter: Payer: Self-pay | Admitting: Internal Medicine

## 2012-02-08 VITALS — BP 140/90 | HR 75 | Temp 98.4°F | Wt 190.0 lb

## 2012-02-08 DIAGNOSIS — F32A Depression, unspecified: Secondary | ICD-10-CM

## 2012-02-08 DIAGNOSIS — G894 Chronic pain syndrome: Secondary | ICD-10-CM

## 2012-02-08 DIAGNOSIS — R5381 Other malaise: Secondary | ICD-10-CM

## 2012-02-08 DIAGNOSIS — F329 Major depressive disorder, single episode, unspecified: Secondary | ICD-10-CM

## 2012-02-08 DIAGNOSIS — R5383 Other fatigue: Secondary | ICD-10-CM

## 2012-02-08 LAB — CBC WITH DIFFERENTIAL/PLATELET
Basophils Absolute: 0 10*3/uL (ref 0.0–0.1)
Basophils Relative: 0.5 % (ref 0.0–3.0)
Eosinophils Absolute: 0.2 10*3/uL (ref 0.0–0.7)
Hemoglobin: 16 g/dL (ref 13.0–17.0)
Lymphocytes Relative: 29.2 % (ref 12.0–46.0)
MCHC: 34.7 g/dL (ref 30.0–36.0)
Monocytes Relative: 6.7 % (ref 3.0–12.0)
Neutro Abs: 4.3 10*3/uL (ref 1.4–7.7)
Neutrophils Relative %: 60.9 % (ref 43.0–77.0)
RBC: 5.14 Mil/uL (ref 4.22–5.81)

## 2012-02-08 LAB — HEPATIC FUNCTION PANEL
ALT: 17 U/L (ref 0–53)
Alkaline Phosphatase: 73 U/L (ref 39–117)
Bilirubin, Direct: 0 mg/dL (ref 0.0–0.3)
Total Bilirubin: 0.7 mg/dL (ref 0.3–1.2)

## 2012-02-08 LAB — BASIC METABOLIC PANEL
CO2: 29 mEq/L (ref 19–32)
Calcium: 9.3 mg/dL (ref 8.4–10.5)
Creatinine, Ser: 1 mg/dL (ref 0.4–1.5)
GFR: 89.26 mL/min (ref >60.00)
Sodium: 139 mEq/L (ref 135–145)

## 2012-02-08 MED ORDER — CITALOPRAM HYDROBROMIDE 20 MG PO TABS: 20.0000 mg | ORAL_TABLET | Freq: Every day | ORAL | Status: DC

## 2012-02-08 NOTE — Patient Instructions (Signed)
Please call if you have any problems with the new medication 

## 2012-02-08 NOTE — Assessment & Plan Note (Signed)
Has had multiple leg surgeries and now with chronic pain Will continue the ibuprofen Refer for pain specialist

## 2012-02-08 NOTE — Progress Notes (Signed)
  Subjective:    Patient ID: [name], male    DOB: [birth year], 37 y.o.   MRN: [id number]  HPI Here with his mother  Discussed smoking He would like to quit---feels it would be difficult with all that is going on  Living with parents now Disabled for some years due to his hip problems  Ongoing pain from hip and femur No surgical options at this point--has reviewed this with Dr Mickle Plumb who reviewed it with his colleagues Has done PT--not really helpful Would consider vocational rehab but feels he can't do it due to severity of ongoing pain Is interested in pain referral Using just ibuprofen now-- 800mg  tid and occ 4 times per day Can't sleep due to the pain Multiple narcotics in past----oxycodone IR seemed to work the best   Review of Systems  Constitutional: Positive for fatigue. Negative for unexpected weight change.  HENT: Positive for sneezing. Negative for rhinorrhea.   Eyes: Positive for visual disturbance.       Vision is out of focus at times---wonders if it may be his blood pressure running high  Respiratory: Positive for cough and shortness of breath.        Some troubles since he has had the rib pain  Cardiovascular: Positive for chest pain and palpitations. Negative for leg swelling.       Occ sense of heart being irregular  Gastrointestinal: Negative for nausea, abdominal pain and constipation.       No heartburn  Genitourinary: Negative for urgency and difficulty urinating.  Musculoskeletal: Positive for back pain and arthralgias.       Right rib pain---reviewed his urgent care note  Skin: Negative for rash.  Neurological: Positive for dizziness, tremors and headaches. Negative for syncope.       Gets shaky at times--- better if he eats something  Hematological: Positive for adenopathy.       Cervical nodes in past  Psychiatric/Behavioral: Positive for sleep disturbance and dysphoric mood. The patient is nervous/anxious.        Objective:   Physical  Exam  Constitutional: He appears well-developed and well-nourished.       Clearly in pain Trouble moving around due to pain Guards his right ribs  HENT:  Mouth/Throat: Oropharynx is clear and moist. No oropharyngeal exudate.  Neck: Normal range of motion. No thyromegaly present.  Cardiovascular: Normal rate, regular rhythm, normal heart sounds and intact distal pulses.  Exam reveals no gallop.   No murmur heard. Pulmonary/Chest: Effort normal and breath sounds normal. No respiratory distress. He has no wheezes. He has no rales.  Abdominal: Soft. There is no tenderness.  Musculoskeletal: He exhibits no edema.  Lymphadenopathy:    He has no cervical adenopathy.  Psychiatric:       Depressed Appropriate affect No thought process disturbances          Assessment & Plan:

## 2012-02-08 NOTE — Assessment & Plan Note (Signed)
Non specific and most likely from the pain and depression Will check labs

## 2012-02-08 NOTE — Assessment & Plan Note (Signed)
Seems to have a secondary process from the pain Still might benefit from antidepressant Will start citalopram

## 2012-02-09 ENCOUNTER — Encounter: Payer: Self-pay | Admitting: *Deleted

## 2012-02-10 NOTE — Progress Notes (Signed)
  Subjective:    Patient ID: [name], male    DOB: [birth year], 37 y.o.   MRN: [id number]  HPI Current Outpatient Prescriptions on File Prior to Visit  Medication Sig Dispense Refill  . HYDROcodone-acetaminophen (NORCO/VICODIN) 5-325 MG per tablet Take 1 tablet by mouth every 4 (four) hours as needed for pain.  15 tablet  0  . citalopram (CELEXA) 20 MG tablet Take 1 tablet (20 mg total) by mouth daily.  30 tablet  5    Allergies  Allergen Reactions  . Fluoxetine Hcl     REACTION: paranoia  . Sertraline Hcl     REACTION: didn`t tolerate    Past Medical History  Diagnosis Date  . Anxiety   . Depression     Past Surgical History  Procedure Date  . Hip arthroscopy [id number]    right hip  . Joint replacement 2009    Right THR-- [name]  . Femur bone graft 2011    Cadaver-- Dr Mickle Plumb at [facility] History  Problem Relation Age of Onset  . Heart disease Father   . Birth defects Other   . Heart disease Other   . Diabetes Other   . Hypertension Other     History   Social History  . Marital Status: Legally Separated    Spouse Name: N/A    Number of Children: 2  . Years of Education: N/A   Occupational History  . Disabled since 2012     from hip   Social History Main Topics  . Smoking status: Current Every Day Smoker  . Smokeless tobacco: Never Used  . Alcohol Use: Not on file  . Drug Use: Not on file  . Sexually Active: Not on file   Other Topics Concern  . Not on file   Social History Narrative   Legally seperated since ~[address]92 children with their mother      Review of Systems     Objective:   Physical Exam        Assessment & Plan:

## 2012-02-11 ENCOUNTER — Encounter: Payer: Self-pay | Admitting: Physical Medicine & Rehabilitation

## 2012-03-03 ENCOUNTER — Encounter: Payer: Self-pay | Admitting: Physical Medicine & Rehabilitation

## 2012-03-03 ENCOUNTER — Ambulatory Visit (HOSPITAL_BASED_OUTPATIENT_CLINIC_OR_DEPARTMENT_OTHER): Payer: Medicare Other | Admitting: Physical Medicine & Rehabilitation

## 2012-03-03 ENCOUNTER — Encounter: Payer: Medicare Other | Attending: Physical Medicine & Rehabilitation

## 2012-03-03 VITALS — BP 150/98 | HR 60 | Resp 14 | Ht 69.0 in | Wt 186.0 lb

## 2012-03-03 DIAGNOSIS — R209 Unspecified disturbances of skin sensation: Secondary | ICD-10-CM | POA: Diagnosis not present

## 2012-03-03 DIAGNOSIS — G8928 Other chronic postprocedural pain: Secondary | ICD-10-CM | POA: Diagnosis not present

## 2012-03-03 DIAGNOSIS — G5711 Meralgia paresthetica, right lower limb: Secondary | ICD-10-CM | POA: Insufficient documentation

## 2012-03-03 DIAGNOSIS — M25559 Pain in unspecified hip: Secondary | ICD-10-CM | POA: Insufficient documentation

## 2012-03-03 DIAGNOSIS — G5710 Meralgia paresthetica, unspecified lower limb: Secondary | ICD-10-CM

## 2012-03-03 DIAGNOSIS — Z96649 Presence of unspecified artificial hip joint: Secondary | ICD-10-CM | POA: Diagnosis not present

## 2012-03-03 DIAGNOSIS — Z5181 Encounter for therapeutic drug level monitoring: Secondary | ICD-10-CM

## 2012-03-03 DIAGNOSIS — R52 Pain, unspecified: Secondary | ICD-10-CM

## 2012-03-03 MED ORDER — GABAPENTIN 100 MG PO CAPS: 100.0000 mg | ORAL_CAPSULE | Freq: Three times a day (TID) | ORAL | Status: DC

## 2012-03-03 NOTE — Patient Instructions (Signed)
Recommend aquatic exercise Will start gabapentin which is I nerve pain medicine 100 mg 3 times a day. We may have to go up to 300 mg or higher I will see you back in 2 weeks and decide if we need to change her medicine

## 2012-03-03 NOTE — Progress Notes (Signed)
Subjective:    Patient ID: [name], male    DOB: 1975-09-22, 37 y.o.   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 Postoperatively he had continued pain. Continue workup revealed distal femoral component tip causing lateral hip pain. [facility] on 11/09/2009. Postoperatively continued to have pain. Was treated with narcotic analgesic such as oxycodone.Pain is anterior distal thigh as well as lateral distal thigh.  Intermittent tingling as well.  Sometimes uses crutches.  He is currently on disability Had physical therapy after the hip replacement as well as the allograft placement Last x-rays were performed at [facility] orthopedic department approximately 6 or 7 months ago Had some burning pain that was severe which improved with Neurontin. Pain is worse with standing than with sitting. His back is starting to get sore because he sitting a lot                   Pain Inventory Average Pain 7 Pain Right Now 7 My pain is sharp, burning, stabbing and aching  In the last 24 hours, has pain interfered with the following? General activity 2 Relation with others 0 Enjoyment of life 0 What TIME of day is your pain at its worst? all the time Sleep (in general) Poor  Pain is worse with: walking, bending, sitting, inactivity and standing Pain improves with: medication Relief from Meds: 4  Mobility walk without assistance walk with assistance ability to climb steps?  no do you drive?  no  Function disabled: date disabled 2012 I need assistance with the following:  household duties and shopping  Neuro/Psych weakness numbness tingling trouble walking spasms anxiety  Prior Studies x-rays  Physicians involved in your care Primary care Dr Alphonsus Sias   Family History  Problem Relation Age of Onset  . Heart disease Father    . Birth defects Other   . Heart disease Other   . Diabetes Other   . Hypertension Other    History   Social History  . Marital Status: Legally Separated    Spouse Name: N/A    Number of Children: 2  . Years of Education: N/A   Occupational History  . Disabled since 2012     from hip   Social History Main Topics  . Smoking status: Current Every Day Smoker  . Smokeless tobacco: Never Used  . Alcohol Use: None  . Drug Use: None  . Sexually Active: None   Other Topics Concern  . None   Social History Narrative   Legally seperated since ~20092 children with their mother   Past Surgical History  Procedure Date  . Hip arthroscopy [id number]    right hip  . Joint replacement 2009    Right THR-- [name]  . Femur bone graft 2011    Cadaver-- Dr Mickle [facility]umb at [facility]   Past Medical History  Diagnosis Date  . Anxiety   . Depression    BP 150/98  Pulse 60  Resp 14  Ht 5\' 9"  (1.753 m)  Wt 186 lb (84.369 kg)  BMI 27.47 kg/m2  SpO2 97%     Review of Systems  Constitutional: Positive for diaphoresis and unexpected weight change.  Musculoskeletal: Positive for back pain and gait problem.  Neurological: Positive for weakness and numbness.  Psychiatric/Behavioral: The patient is nervous/anxious.   All other systems reviewed and are negative.  Objective:   Physical Exam  Constitutional: He is oriented to person, place, and time.  Musculoskeletal:       Right hip: He exhibits decreased strength. He exhibits no tenderness and no swelling.       Left hip: Normal.       Right knee: Normal.       Left knee: He exhibits deformity.       Right ankle: Normal.       Left ankle: Normal.       Lumbar back: Normal.       Well healed right lateral thigh incision no hypertrophic scar no hypersensitivity to touch No dys vascular changes  Prominent left patella tendon  Neurological: He is alert and oriented to person, place, and time. He has normal strength and normal  reflexes. He displays atrophy. A sensory deficit is present. Gait abnormal.       Mild right thigh atrophy Antalgic gait favors right leg, no evidence of toe drag or knee instability    Right thigh circumference 10 cm above superior patellar. 42 cm Left thigh circumference 10 cm above superior patellar 43 cm Sensation is mildly reduced to pinprick in the right anterior thigh and severely reduced to pinprick in the right lateral thigh. Normal sensation in the left side      Assessment & Plan:  1. 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 The pain characteristics and examination are most consistent with neuropathic pain.The right lateral femoral cutaneous nerve has the most dense sensory loss. The anterior thigh numbness is more subtle and represents the femoral nerve distribution. He has had relief with gabapentin in the past and we will start this today 100 mg 3 times a day. We may need to titrate upward. Other potential medications would include Lyrica or nortriptyline. If the patient does not get sufficient relief from oral medications he may benefit from nerve block. I would first perform an EMG prior to any type of nerve block to help document nerve injury. Discussed with patient agrees with plan Return to clinic 2 weeks. I do not think narcotic analgesics will be part of treatment plan

## 2012-03-15 ENCOUNTER — Ambulatory Visit: Payer: Medicare Other | Admitting: Internal Medicine

## 2012-03-17 ENCOUNTER — Ambulatory Visit: Payer: Medicare Other | Admitting: Internal Medicine

## 2012-03-18 ENCOUNTER — Ambulatory Visit: Payer: Medicare Other | Admitting: Physical Medicine & Rehabilitation

## 2012-03-22 ENCOUNTER — Encounter: Payer: Medicare Other | Attending: Physical Medicine & Rehabilitation

## 2012-03-22 ENCOUNTER — Encounter: Payer: Self-pay | Admitting: Physical Medicine & Rehabilitation

## 2012-03-22 ENCOUNTER — Ambulatory Visit (HOSPITAL_BASED_OUTPATIENT_CLINIC_OR_DEPARTMENT_OTHER): Payer: Medicare Other | Admitting: Physical Medicine & Rehabilitation

## 2012-03-22 VITALS — BP 132/90 | HR 56 | Resp 14 | Ht 69.0 in | Wt 191.0 lb

## 2012-03-22 DIAGNOSIS — G5710 Meralgia paresthetica, unspecified lower limb: Secondary | ICD-10-CM

## 2012-03-22 DIAGNOSIS — M25559 Pain in unspecified hip: Secondary | ICD-10-CM | POA: Insufficient documentation

## 2012-03-22 DIAGNOSIS — R209 Unspecified disturbances of skin sensation: Secondary | ICD-10-CM | POA: Diagnosis not present

## 2012-03-22 DIAGNOSIS — G8928 Other chronic postprocedural pain: Secondary | ICD-10-CM

## 2012-03-22 DIAGNOSIS — Z96649 Presence of unspecified artificial hip joint: Secondary | ICD-10-CM | POA: Diagnosis not present

## 2012-03-22 DIAGNOSIS — G5711 Meralgia paresthetica, right lower limb: Secondary | ICD-10-CM

## 2012-03-22 MED ORDER — GABAPENTIN 300 MG PO CAPS: 300.0000 mg | ORAL_CAPSULE | Freq: Every day | ORAL | Status: DC

## 2012-03-22 MED ORDER — GABAPENTIN 100 MG PO CAPS: 100.0000 mg | ORAL_CAPSULE | Freq: Three times a day (TID) | ORAL | Status: DC

## 2012-03-22 NOTE — Progress Notes (Signed)
Subjective:    Patient ID: [name], male    DOB: [birth year], 37 y.o.   MRN: [id number]  HPI Burning pain in the leg less intense on gabapentin Pain Inventory Average Pain 6 Pain Right Now 7 My pain is constant, sharp, burning, dull, stabbing, tingling and aching  In the last 24 hours, has pain interfered with the following? General activity 8 Relation with others 8 Enjoyment of life 8 What TIME of day is your pain at its worst? morning Sleep (in general) Fair  Pain is worse with: walking, bending, standing and some activites Pain improves with: medication Relief from Meds: 4  Mobility walk without assistance how many minutes can you walk? 10 ability to climb steps?  yes do you drive?  no Do you have any goals in this area?  yes  Function disabled: date disabled 2009  Neuro/Psych numbness tingling trouble walking spasms depression anxiety  Prior Studies Any changes since last visit?  no  Physicians involved in your care Any changes since last visit?  no   Family History  Problem Relation Age of Onset  . Heart disease Father   . Birth defects Other   . Heart disease Other   . Diabetes Other   . Hypertension Other    History   Social History  . Marital Status: Legally Separated    Spouse Name: N/A    Number of Children: 2  . Years of Education: N/A   Occupational History  . Disabled since 2012     from hip   Social History Main Topics  . Smoking status: Current Every Day Smoker  . Smokeless tobacco: Never Used  . Alcohol Use: None  . Drug Use: None  . Sexually Active: None   Other Topics Concern  . None   Social History Narrative   Legally seperated since ~2009   2 children with their mother   Past Surgical History  Procedure Laterality Date  . Hip arthroscopy  [id number]    right hip  . Joint replacement  2009    Right THR-- [name]  . Femur bone graft  2011    Cadaver-- Dr Mickle Plumb at [facility]   Past Medical History   Diagnosis Date  . Anxiety   . Depression    BP 132/90  Pulse 56  Resp 14  Ht 5\' 9"  (1.753 m)  Wt 191 lb (86.637 kg)  BMI 28.19 kg/m2  SpO2 98%      Review of Systems  Constitutional: Positive for diaphoresis.  Musculoskeletal: Positive for gait problem.  Neurological: Positive for numbness.  Psychiatric/Behavioral: Positive for dysphoric mood. The patient is nervous/anxious.   All other systems reviewed and are negative.       Objective:   Physical Exam  Constitutional: He is oriented to person, place, and time.  Musculoskeletal:  Right hip: He exhibits decreased strength. He exhibits no tenderness and no swelling.  Left hip: Normal.  Right knee: Normal.  Left knee: He exhibits deformity.  Right ankle: Normal.  Left ankle: Normal.  Lumbar back: Normal.  Well healed right lateral thigh incision no hypertrophic scar no hypersensitivity to touch No dys vascular changes  Prominent left patella tendon  Neurological: He is alert and oriented to person, place, and time. He has normal strength and normal reflexes. He displays atrophy. A sensory deficit is present. Gait abnormal.  Mild right thigh atrophy Antalgic gait favors right leg, no evidence of toe drag or knee instability  Assessment & Plan:  1. 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 The pain characteristics and examination are most consistent with neuropathic pain.The right lateral femoral cutaneous nerve has the most dense sensory loss. The anterior thigh numbness is more subtle and represents the femoral nerve distribution. He has had relief with gabapentin in the past and he has some benfit from 100 mg 3 times a day.Add 300mg  qhs. Other potential medications would include Lyrica or nortriptyline. If the patient does not get sufficient relief from oral medications he may benefit from nerve block. I would first perform an EMG prior to  any type of nerve block to help document nerve injury. Discussed with patient agrees with plan Return to clinic 2 weeks. I do not think narcotic analgesics will be part of treatment plan

## 2012-05-17 ENCOUNTER — Ambulatory Visit: Payer: Medicare Other | Admitting: Physical Medicine & Rehabilitation

## 2012-05-17 ENCOUNTER — Encounter: Payer: Medicare Other | Attending: Physical Medicine & Rehabilitation

## 2012-05-19 ENCOUNTER — Ambulatory Visit (HOSPITAL_BASED_OUTPATIENT_CLINIC_OR_DEPARTMENT_OTHER): Payer: Medicare Other | Admitting: Physical Medicine & Rehabilitation

## 2012-05-19 ENCOUNTER — Encounter: Payer: Medicare Other | Attending: Physical Medicine & Rehabilitation

## 2012-05-19 ENCOUNTER — Encounter: Payer: Self-pay | Admitting: Physical Medicine & Rehabilitation

## 2012-05-19 VITALS — BP 134/95 | HR 57 | Resp 14 | Ht 69.0 in | Wt 200.6 lb

## 2012-05-19 DIAGNOSIS — F17200 Nicotine dependence, unspecified, uncomplicated: Secondary | ICD-10-CM | POA: Diagnosis not present

## 2012-05-19 DIAGNOSIS — Z96649 Presence of unspecified artificial hip joint: Secondary | ICD-10-CM | POA: Insufficient documentation

## 2012-05-19 DIAGNOSIS — M25559 Pain in unspecified hip: Secondary | ICD-10-CM | POA: Insufficient documentation

## 2012-05-19 DIAGNOSIS — G5711 Meralgia paresthetica, right lower limb: Secondary | ICD-10-CM

## 2012-05-19 DIAGNOSIS — G8928 Other chronic postprocedural pain: Secondary | ICD-10-CM | POA: Diagnosis not present

## 2012-05-19 DIAGNOSIS — G5710 Meralgia paresthetica, unspecified lower limb: Secondary | ICD-10-CM

## 2012-05-19 MED ORDER — GABAPENTIN 300 MG PO CAPS: 300.0000 mg | ORAL_CAPSULE | Freq: Four times a day (QID) | ORAL | Status: DC

## 2012-05-19 NOTE — Progress Notes (Signed)
Subjective:    Patient ID: [name], male    DOB: Jun 18, 1975, 37 y.o.   MRN: [id number]  HPI 37 year old male with history of right total hip replacement in January of 2009 for degenerative joint disease. Postoperatively he had continued pain. Continue workup revealed distal femoral component tip causing lateral hip pain. [facility] on 11/09/2009. Postoperatively continued to have pain. Was treated with narcotic analgesic such as oxycodone.Pain is anterior distal thigh as well as lateral distal thigh.  Intermittent tingling as well.  Sometimes uses crutches.  He is currently on disability Had physical therapy after the hip replacement as well as the allograft placement Last x-rays were performed at [facility] orthopedic department approximately 6 or 7 months ago Had some burning pain that was severe which improved with Neurontin. Pain is worse with standing than with sitting. His back is starting to get sore because he sitting a lot  Pain Inventory Average Pain 5 Pain Right Now 6 My pain is sharp, stabbing, tingling and aching  In the last 24 hours, has pain interfered with the following? General activity 2 Relation with others 4 Enjoyment of life 2 What TIME of day is your pain at its worst? all Sleep (in general) Poor  Still not sleeping even after the increased gabapentin at night  Pain is worse with: walking, bending, sitting, inactivity, standing and some activites Pain improves with: medication Relief from Meds: no pain medication  Mobility walk without assistance ability to climb steps?  no do you drive?  yes  Function disabled: date disabled .  Neuro/Psych weakness numbness trouble walking spasms depression  Prior Studies Any changes since last visit?  no  Physicians involved in your care Any changes since last visit?  no   Family History  Problem Relation Age of Onset  . Heart  disease Father   . Birth defects Other   . Heart disease Other   . Diabetes Other   . Hypertension Other    History   Social History  . Marital Status: Legally Separated    Spouse Name: N/A    Number of Children: 2  . Years of Education: N/A   Occupational History  . Disabled since 2012     from hip   Social History Main Topics  . Smoking status: Current Every Day Smoker  . Smokeless tobacco: Never Used  . Alcohol Use: None  . Drug Use: None  . Sexually Active: None   Other Topics Concern  . None   Social History Narrative   Legally seperated since ~2009   2 children with their mother   Past Surgical History  Procedure Laterality Date  . Hip arthroscopy  [id number]    right hip  . Joint replacement  2009    Right THR-- [name]  . Femur bone graft  2011    Cadaver-- Dr Mickle Plumb at [facility]   Past Medical History  Diagnosis Date  . Anxiety   . Depression    BP 134/95  Pulse 57  Resp 14  Ht 5\' 9"  (1.753 m)  Wt 200 lb 9.6 oz (90.992 kg)  BMI 29.61 kg/m2  SpO2 95%     Review of Systems  Constitutional: Positive for unexpected weight change.  Musculoskeletal: Positive for gait problem.       Spasms  Neurological: Positive for weakness and numbness.  Psychiatric/Behavioral: Positive for dysphoric mood.  All other systems reviewed and are negative.  Objective:   Physical Exam  Constitutional: He is oriented to person, place, and time.  Musculoskeletal:  Right hip: He exhibits decreased strength. He exhibits no tenderness and no swelling.  Left hip: Normal.  Right knee: Normal.  Left knee: He exhibits deformity.  Right ankle: Normal.  Left ankle: Normal.  Lumbar back: Normal.  Well healed right lateral thigh incision no hypertrophic scar no hypersensitivity to touch No dysvascular changes  Prominent left patella tendon  Neurological: He is alert and oriented to person, place, and time. He has normal strength and normal reflexes. He displays  atrophy. A sensory deficit is present.Right lateral thigh only Gait abnormal. Mild antalgia favors Right leg Mild right thigh atrophy        Assessment & Plan:  1. 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 The pain characteristics and examination are most consistent with neuropathic pain.The right lateral femoral cutaneous nerve has the most dense sensory loss. The anterior thigh numbness is more subtle and represents the femoral nerve distribution. He has had relief with gabapentin and will increase to 300mg  QID. Other potential medications would include Lyrica or nortriptyline. If the patient does not get sufficient relief from oral medications he may benefit from nerve block. I would first perform an EMG prior to any type of nerve block to help document nerve injury. Discussed with patient agrees with plan Return to clinic 2 mo UDS positive for cocaine,narcotic analgesics will not be part of treatment plan Patient offered referral for drug treatment he refused.

## 2012-07-18 ENCOUNTER — Encounter: Payer: Medicare Other | Attending: Physical Medicine & Rehabilitation

## 2012-07-18 ENCOUNTER — Ambulatory Visit: Payer: Medicare Other | Admitting: Physical Medicine & Rehabilitation

## 2012-07-29 ENCOUNTER — Encounter: Payer: Self-pay | Admitting: Physical Medicine and Rehabilitation

## 2012-07-29 ENCOUNTER — Encounter
Payer: Medicare Other | Attending: Physical Medicine and Rehabilitation | Admitting: Physical Medicine and Rehabilitation

## 2012-07-29 VITALS — BP 154/100 | HR 70 | Resp 17 | Ht 69.0 in | Wt 205.0 lb

## 2012-07-29 DIAGNOSIS — M25559 Pain in unspecified hip: Secondary | ICD-10-CM | POA: Diagnosis not present

## 2012-07-29 DIAGNOSIS — G8928 Other chronic postprocedural pain: Secondary | ICD-10-CM | POA: Diagnosis not present

## 2012-07-29 DIAGNOSIS — F17200 Nicotine dependence, unspecified, uncomplicated: Secondary | ICD-10-CM | POA: Insufficient documentation

## 2012-07-29 DIAGNOSIS — Z96649 Presence of unspecified artificial hip joint: Secondary | ICD-10-CM | POA: Insufficient documentation

## 2012-07-29 DIAGNOSIS — R209 Unspecified disturbances of skin sensation: Secondary | ICD-10-CM | POA: Insufficient documentation

## 2012-07-29 DIAGNOSIS — G894 Chronic pain syndrome: Secondary | ICD-10-CM

## 2012-07-29 MED ORDER — GABAPENTIN 300 MG PO CAPS: 300.0000 mg | ORAL_CAPSULE | Freq: Three times a day (TID) | ORAL | Status: DC

## 2012-07-29 MED ORDER — METHOCARBAMOL 500 MG PO TABS: 500.0000 mg | ORAL_TABLET | Freq: Three times a day (TID) | ORAL | Status: DC

## 2012-07-29 NOTE — Patient Instructions (Signed)
Try to work out in the pool

## 2012-07-29 NOTE — Progress Notes (Signed)
Subjective:    Patient ID: [name], male    DOB: 05/06/1975, 37 y.o.   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 Postoperatively he had continued pain. Continue workup revealed distal femoral component tip causing lateral hip pain. [facility] on 11/09/2009. Postoperatively continued to have pain. Was treated with narcotic analgesic such as oxycodone.Pain is anterior distal thigh as well as lateral distal thigh. Intermittent tingling as well. Sometimes uses crutches. He is currently on disability . He states, that his right LE pain has improved some with the gabapentin, but now he has more LBP, which does not radiate.   Pain Inventory Average Pain 7 Pain Right Now 8 My pain is na  In the last 24 hours, has pain interfered with the following? General activity 8 Relation with others 8 Enjoyment of life 9 What TIME of day is your pain at its worst? constant Sleep (in general) Poor  Pain is worse with: walking, bending, sitting, inactivity and standing Pain improves with: na Relief from Meds: na  Mobility walk without assistance ability to climb steps?  no do you drive?  no Do you have any goals in this area?  yes  Function disabled: date disabled na I need assistance with the following:  household duties Do you have any goals in this area?  yes  Neuro/Psych trouble walking spasms depression  Prior Studies Any changes since last visit?  no  Physicians involved in your care Any changes since last visit?  no   Family History  Problem Relation Age of Onset  . Heart disease Father   . Birth defects Other   . Heart disease Other   . Diabetes Other   . Hypertension Other    History   Social History  . Marital Status: Legally Separated    Spouse Name: N/A    Number of Children: 2  . Years of Education: N/A    Occupational History  . Disabled since 2012     from hip   Social History Main Topics  . Smoking status: Current Every Day Smoker  . Smokeless tobacco: Never Used  . Alcohol Use: None  . Drug Use: None  . Sexually Active: None   Other Topics Concern  . None   Social History Narrative   Legally seperated since ~2009   2 children with their mother   Past Surgical History  Procedure Laterality Date  . Hip arthroscopy  [id number]    right hip  . Joint replacement  2009    Right THR-- [name]  . Femur bone graft  2011    Cadaver-- Dr Mickle Plumb at [facility]   Past Medical History  Diagnosis Date  . Anxiety   . Depression    BP 154/100  Pulse 70  Resp 17  Ht 5\' 9"  (1.753 m)  Wt 205 lb (92.987 kg)  BMI 30.26 kg/m2  SpO2 98%     Review of Systems  Constitutional: Positive for unexpected weight change.  Respiratory: Positive for cough.   Musculoskeletal: Positive for gait problem.  Neurological:       Spasms  Psychiatric/Behavioral: Positive for dysphoric mood.  All other systems reviewed and are negative.       Objective:   Physical Exam  Constitutional: He is oriented to person, place, and time. He appears well-developed and well-nourished.  HENT:  Head: Normocephalic.  Neck: Neck  supple.  Musculoskeletal: He exhibits tenderness.  Neurological: He is alert and oriented to person, place, and time.  Skin: Skin is warm and dry.  Psychiatric: He has a normal mood and affect.     Symmetric normal motor tone is noted throughout. Normal muscle bulk. Muscle testing reveals 5/5 muscle strength of the upper extremity, and 5/5 of the lower extremity, except on the right LE : iliopsoas 3+/5; quadriceps 4+/5; ischios 4+/5 ;glut med and min 4/5; adductors 4+/5 ; tibialis ant 5/5; gastroc 5/5. Full range of motion in upper and lower extremities. ROM of spine is mildly restricted. Fine motor movements are normal in both hands. Sensory is intact and symmetric to light touch,  pinprick and proprioception. DTR in the upper and lower extremity are present and symmetric 1+. No clonus is noted.  Patient arises from chair with mild difficulty. Wide based gait,  With antalgic limp on the right.Able to walk on heels and toes . Tandem walk is stable. No pronator drift. Rhomberg negative.       Assessment & Plan:  1. 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 The pain characteristics and examination are most consistent with neuropathic pain. The numbness and tingling has improved with the gabapentin, increased his gabapentin to 600mg  in the am, 300mg  at noon, and 600mg  at night time.   If the patient does not get sufficient relief from oral medications he may benefit from nerve block. I would first perform an EMG prior to any type of nerve block to help document nerve injury.  Discussed with patient agrees with plan  Return to clinic 2 mo  UDS positive for cocaine,narcotic analgesics will not be part of treatment plan  Patient offered referral for drug treatment he refused.

## 2012-08-04 ENCOUNTER — Telehealth: Payer: Self-pay

## 2012-08-04 NOTE — Telephone Encounter (Signed)
Patient thinks he is having an allergic reaction to his muscle relaxer.  He says his hands started swelling.  He took a benedryl and is doing ok now.  Advised to stop robaxin.  Added this to his allergy list.  He understood.   Also advised patient if he became short of breath to go to hospital.  Patient needs different muscle relaxer.

## 2012-08-04 NOTE — Telephone Encounter (Signed)
I don't think patient needs long-term muscle relaxants. Continue gabapentin

## 2012-08-08 ENCOUNTER — Encounter (HOSPITAL_COMMUNITY): Payer: Self-pay | Admitting: Emergency Medicine

## 2012-08-08 ENCOUNTER — Emergency Department (INDEPENDENT_AMBULATORY_CARE_PROVIDER_SITE_OTHER)
Admission: EM | Admit: 2012-08-08 | Discharge: 2012-08-08 | Disposition: A | Discharge status: Home / Self Care | Payer: Medicare Other | Source: Home / Self Care | Attending: Emergency Medicine | Admitting: Emergency Medicine

## 2012-08-08 DIAGNOSIS — L259 Unspecified contact dermatitis, unspecified cause: Secondary | ICD-10-CM

## 2012-08-08 MED ORDER — PREDNISONE 20 MG PO TABS: 20.0000 mg | ORAL_TABLET | Freq: Every day | ORAL | Status: AC

## 2012-08-08 MED ORDER — TRIAMCINOLONE ACETONIDE 0.025 % EX OINT: TOPICAL_OINTMENT | Freq: Two times a day (BID) | CUTANEOUS | Status: DC

## 2012-08-08 NOTE — Telephone Encounter (Signed)
Left message for patient to call office regarding his allergic reaction and medications.

## 2012-08-08 NOTE — ED Provider Notes (Addendum)
History    CSN: [id number] Arrival date & time 08/08/12  1641  First MD Initiated Contact with Patient 08/08/12 1712     Chief Complaint  Patient presents with  . Rash   (Consider location/radiation/quality/duration/timing/severity/associated sxs/prior Treatment) HPI Comments:   Patient presents this evening to urgent care complaining of an itchy rash that started in both of his hands and has extended to the midportion of his forearm, itchy red and somewhat raised. He stopped the medicine that he initially suspected as causing the reaction but it did not stop. Patient denies any further associated symptoms such as difficulty swallowing, breathing or facial swelling.  Rash on both hands, some rash on left forearm, started last Monday or Tuesday.  Patient reports the Friday before started a new a medicine-robaxin.  Patient thought he was reacting to this medicine.  Patient [facility], he called them and was told to stop medicine.  Patient reports rash has worsened.  Reports taking medicine 4 days before stopping.  Rash on hands started 3 days after starting medicine, and reports removing limbs in yard Sunday, the day before rash started.     Patient is a 37 y.o. male presenting with rash. The history is provided by the patient.  Rash Pain severity:  Mild Timing:  Constant Progression:  Worsening Relieved by:  Nothing Worsened by:  Nothing tried Associated symptoms: no anorexia, no chills, no fatigue, no fever and no nausea    Past Medical History  Diagnosis Date  . Anxiety   . Depression    Past Surgical History  Procedure Laterality Date  . Hip arthroscopy  [id number]    right hip  . Joint replacement  2009    Right THR-- [name]  . Femur bone graft  2011    Cadaver-- Dr Mickle Plumb at [facility] History  Problem Relation Age of Onset  . Heart disease Father   . Birth defects Other   . Heart disease Other   . Diabetes Other    . Hypertension Other    History  Substance Use Topics  . Smoking status: Current Every Day Smoker  . Smokeless tobacco: Never Used  . Alcohol Use: Yes    Review of Systems  Constitutional: Negative for fever, chills, activity change, appetite change and fatigue.  HENT: Negative for neck stiffness.   Gastrointestinal: Negative for nausea and anorexia.  Skin: Positive for rash. Negative for color change, pallor and wound.    Allergies  Fluoxetine hcl; Methocarbamol; and Sertraline hcl  Home Medications   Current Outpatient Rx  Name  Route  Sig  Dispense  Refill  . methocarbamol (ROBAXIN) 500 MG tablet   Oral   Take 1 tablet (500 mg total) by mouth 3 (three) times daily.   90 tablet   2   . citalopram (CELEXA) 20 MG tablet   Oral   Take 1 tablet (20 mg total) by mouth daily.   30 tablet   5   . gabapentin (NEURONTIN) 300 MG capsule   Oral   Take 1 capsule (300 mg total) by mouth 3 (three) times daily. You can add a second tablet in the pm and am,   150 capsule   0   . ibuprofen (ADVIL,MOTRIN) 800 MG tablet   Oral   Take 800 mg by mouth 3 (three) times daily.         . predniSONE (DELTASONE) 20 MG tablet   Oral  Take 1 tablet (20 mg total) by mouth daily.   10 tablet   0   . triamcinolone (KENALOG) 0.025 % ointment   Topical   Apply topically 2 (two) times daily. Apply bid x 7 days   30 g   0    BP 151/109  Pulse 96  Temp(Src) 98.7 F (37.1 C) (Oral)  Resp 16  SpO2 94% Physical Exam  Nursing note and vitals reviewed. Constitutional: He appears well-developed and well-nourished.  Non-toxic appearance. He does not have a sickly appearance. He does not appear ill. No distress.  Neck: Neck supple.  Neurological: He is alert.  Skin: Rash noted. No abrasion, no bruising, no ecchymosis and no purpura noted. Rash is maculopapular. Rash is not vesicular. No cyanosis or erythema. No pallor. Nails show no clubbing.       ED Course  Procedures  (including critical care time) Labs Reviewed - No data to display No results found. No diagnosis found.  MDM  Exam was most consistent with local- contact dermatitis- Discharge Medication List as of 08/08/2012  5:44 PM    START taking these medications   Details  predniSONE (DELTASONE) 20 MG tablet Take 1 tablet (20 mg total) by mouth daily., Starting 08/08/2012, Last dose on Fri 08/12/12, Print    triamcinolone (KENALOG) 0.025 % ointment Apply topically 2 (two) times daily. Apply bid x 7 days, Starting 08/08/2012, Until Discontinued, Print        Jimmie Molly, MD 08/08/12 1805  Jimmie Molly, MD 08/08/12 [phone]

## 2012-08-08 NOTE — ED Notes (Signed)
Rash on both hands, some rash on left forearm, started last Monday or Tuesday.  Patient reports the Friday before started a new a medicine-robaxin.  Patient thought he was reacting to this medicine.  Patient [facility], he called them and was told to stop medicine.  Patient reports rash has worsened.  Reports taking medicine 4 days before stopping.  Rash on hands started 3 days after starting medicine, and reports removing limbs in yard Sunday, the day before rash started.

## 2012-08-10 ENCOUNTER — Encounter: Payer: Self-pay | Admitting: Internal Medicine

## 2012-08-10 ENCOUNTER — Ambulatory Visit (INDEPENDENT_AMBULATORY_CARE_PROVIDER_SITE_OTHER): Payer: Medicare Other | Admitting: Internal Medicine

## 2012-08-10 VITALS — BP 148/100 | HR 71 | Temp 97.7°F | Wt 207.0 lb

## 2012-08-10 DIAGNOSIS — I10 Essential (primary) hypertension: Secondary | ICD-10-CM

## 2012-08-10 DIAGNOSIS — R209 Unspecified disturbances of skin sensation: Secondary | ICD-10-CM

## 2012-08-10 LAB — BASIC METABOLIC PANEL
BUN: 9 mg/dL (ref 6–23)
Chloride: 102 mEq/L (ref 96–112)
Creatinine, Ser: 1.1 mg/dL (ref 0.4–1.5)
Glucose, Bld: 101 mg/dL — ABNORMAL HIGH (ref 70–99)
Potassium: 4.4 mEq/L (ref 3.5–5.1)

## 2012-08-10 MED ORDER — CHLORTHALIDONE 25 MG PO TABS: 25.0000 mg | ORAL_TABLET | Freq: Every day | ORAL | Status: DC

## 2012-08-10 NOTE — Assessment & Plan Note (Signed)
BP Readings from Last 3 Encounters:  08/10/12 148/100  08/08/12 151/109  07/29/12 154/100   Repeat on right 148/88  Increased BP likely related to sedentary lifestyle now and weight gain Has mild symptoms Discussed DASH diet, going to gym regularly Will start low dose diuretic

## 2012-08-10 NOTE — Patient Instructions (Addendum)
Please get back to the [facility] and work out regularly Call if you have any problems with the new medicine  DASH Diet The DASH diet stands for "Dietary Approaches to Stop Hypertension." It is a healthy eating plan that has been shown to reduce high blood pressure (hypertension) in as little as 14 days, while also possibly providing other significant health benefits. These other health benefits include reducing the risk of breast cancer after menopause and reducing the risk of type 2 diabetes, heart disease, colon cancer, and stroke. Health benefits also include weight loss and slowing kidney failure in patients with chronic kidney disease.  DIET GUIDELINES  Limit salt (sodium). Your diet should contain less than 1500 mg of sodium daily.  Limit refined or processed carbohydrates. Your diet should include mostly whole grains. Desserts and added sugars should be used sparingly.  Include small amounts of heart-healthy fats. These types of fats include nuts, oils, and tub margarine. Limit saturated and trans fats. These fats have been shown to be harmful in the body. CHOOSING FOODS  The following food groups are based on a 2000 calorie diet. See your Registered Dietitian for individual calorie needs. Grains and Grain Products (6 to 8 servings daily)  Eat More Often: Whole-wheat bread, brown rice, whole-grain or wheat pasta, quinoa, popcorn without added fat or salt (air popped).  Eat Less Often: White bread, white pasta, white rice, cornbread. Vegetables (4 to 5 servings daily)  Eat More Often: Fresh, frozen, and canned vegetables. Vegetables may be raw, steamed, roasted, or grilled with a minimal amount of fat.  Eat Less Often/Avoid: Creamed or fried vegetables. Vegetables in a cheese sauce. Fruit (4 to 5 servings daily)  Eat More Often: All fresh, canned (in natural juice), or frozen fruits. Dried fruits without added sugar. One hundred percent fruit juice ( cup [237 mL] daily).  Eat Less  Often: Dried fruits with added sugar. Canned fruit in light or heavy syrup. [employer], Fish, and Poultry (2 servings or less daily. One serving is 3 to 4 oz [85-114 g]).  Eat More Often: Ninety percent or leaner ground beef, tenderloin, sirloin. Round cuts of beef, chicken breast, [place] breast. All fish. Grill, bake, or broil your meat. Nothing should be fried.  Eat Less Often/Avoid: Fatty cuts of meat, [place], or chicken leg, thigh, or wing. Fried cuts of meat or fish. Dairy (2 to 3 servings)  Eat More Often: Low-fat or fat-free milk, low-fat plain or light yogurt, reduced-fat or part-skim cheese.  Eat Less Often/Avoid: Milk (whole, 2%).Whole milk yogurt. Full-fat cheeses. Nuts, Seeds, and Legumes (4 to 5 servings per week)  Eat More Often: All without added salt.  Eat Less Often/Avoid: Salted nuts and seeds, canned beans with added salt. Fats and Sweets (limited)  Eat More Often: Vegetable oils, tub margarines without trans fats, sugar-free gelatin. Mayonnaise and salad dressings.  Eat Less Often/Avoid: Coconut oils, palm oils, butter, stick margarine, cream, half and half, cookies, candy, pie. FOR MORE INFORMATION The Dash Diet Eating Plan: www.dashdiet.org Document Released: 01/08/2011 Document Revised: 04/13/2011 Document Reviewed: 01/08/2011 [facility] Patient Information 2014 [place], [place].

## 2012-08-10 NOTE — Progress Notes (Signed)
Subjective:    Patient ID: [name], male    DOB: [birth year], 37 y.o.   MRN: [id number]  HPI Here with mom as usual Has been doing better with pain since seeing Dr Wynn Banker.  Now can walk some  BP has been up Trouble focusing his vision No unilateral vision loss Occasional headaches-- notices it mostly with reading. Using OTC reading glasses  No chest pain Gets SOB-- with exertion (relates to weight gain)  HTN on mom's side ?on dad's side also  Current Outpatient Prescriptions on File Prior to Visit  Medication Sig Dispense Refill  . citalopram (CELEXA) 20 MG tablet Take 1 tablet (20 mg total) by mouth daily.  30 tablet  5  . gabapentin (NEURONTIN) 300 MG capsule Take 1 capsule (300 mg total) by mouth 3 (three) times daily. You can add a second tablet in the pm and am,  150 capsule  0  . ibuprofen (ADVIL,MOTRIN) 800 MG tablet Take 800 mg by mouth 3 (three) times daily.      . methocarbamol (ROBAXIN) 500 MG tablet Take 1 tablet (500 mg total) by mouth 3 (three) times daily.  90 tablet  2  . predniSONE (DELTASONE) 20 MG tablet Take 1 tablet (20 mg total) by mouth daily.  10 tablet  0  . triamcinolone (KENALOG) 0.025 % ointment Apply topically 2 (two) times daily. Apply bid x 7 days  30 g  0   No current facility-administered medications on file prior to visit.    Allergies  Allergen Reactions  . Fluoxetine Hcl     REACTION: paranoia  . Methocarbamol     Rash  . Sertraline Hcl     REACTION: didn`t tolerate    Past Medical History  Diagnosis Date  . Anxiety   . Depression   . Hypertension     Past Surgical History  Procedure Laterality Date  . Hip arthroscopy  [id number]    right hip  . Joint replacement  2009    Right THR-- [name]  . Femur bone graft  2011    Cadaver-- Dr Mickle Plumb at [facility] History  Problem Relation Age of Onset  . Heart disease Father   . Birth defects Other   . Heart disease Other   . Diabetes Other   . Hypertension Other      History   Social History  . Marital Status: Legally Separated    Spouse Name: N/A    Number of Children: 2  . Years of Education: N/A   Occupational History  . Disabled since 2012     from hip   Social History Main Topics  . Smoking status: Current Every Day Smoker  . Smokeless tobacco: Never Used  . Alcohol Use: Yes  . Drug Use: No  . Sexually Active: Not on file   Other Topics Concern  . Not on file   Social History Narrative   Legally seperated since ~2009      2 children with their mother   Review of Systems Sleep is variable--some daytime fatigue Mom states he snores--no witnessed apnea or gasping,etc Weight is up 20# since my last visit --he relates to appetite increase with gabapentin    Objective:   Physical Exam  Constitutional: He appears well-developed and well-nourished. No distress.  Neck: Normal range of motion. Neck supple.  Cardiovascular: Normal rate, regular rhythm, normal heart sounds and intact distal pulses.  Exam reveals no gallop.   No murmur heard. Pulmonary/Chest:  Effort normal and breath sounds normal. No respiratory distress. He has no wheezes. He has no rales.  Abdominal: Soft. There is no tenderness.  Musculoskeletal: He exhibits no edema and no tenderness.  Lymphadenopathy:    He has no cervical adenopathy.          Assessment & Plan:

## 2012-08-11 ENCOUNTER — Encounter: Payer: Self-pay | Admitting: *Deleted

## 2012-09-12 ENCOUNTER — Telehealth: Payer: Self-pay

## 2012-09-12 MED ORDER — GABAPENTIN 300 MG PO CAPS: 300.0000 mg | ORAL_CAPSULE | Freq: Three times a day (TID) | ORAL | Status: DC

## 2012-09-12 NOTE — Telephone Encounter (Signed)
Left a message for patient that his medication was refilled.

## 2012-09-12 NOTE — Telephone Encounter (Signed)
Patient needs a refill for gabapentin

## 2012-09-16 ENCOUNTER — Ambulatory Visit: Payer: Medicare Other | Admitting: Family Medicine

## 2012-09-16 DIAGNOSIS — Z0289 Encounter for other administrative examinations: Secondary | ICD-10-CM

## 2012-09-22 ENCOUNTER — Ambulatory Visit (INDEPENDENT_AMBULATORY_CARE_PROVIDER_SITE_OTHER): Payer: Medicare Other | Admitting: Internal Medicine

## 2012-09-22 ENCOUNTER — Encounter: Payer: Self-pay | Admitting: Internal Medicine

## 2012-09-22 VITALS — BP 128/94 | HR 114 | Temp 97.5°F | Wt 208.0 lb

## 2012-09-22 DIAGNOSIS — I10 Essential (primary) hypertension: Secondary | ICD-10-CM

## 2012-09-22 DIAGNOSIS — J019 Acute sinusitis, unspecified: Secondary | ICD-10-CM

## 2012-09-22 MED ORDER — AMOXICILLIN-POT CLAVULANATE 875-125 MG PO TABS: 1.0000 | ORAL_TABLET | Freq: Two times a day (BID) | ORAL | Status: DC

## 2012-09-22 MED ORDER — BENZONATATE 200 MG PO CAPS: 200.0000 mg | ORAL_CAPSULE | Freq: Three times a day (TID) | ORAL | Status: DC | PRN

## 2012-09-22 NOTE — Progress Notes (Signed)
  Subjective:    Patient ID: [name], male    DOB: 1975-07-29, 37 y.o.   MRN: [id number]  HPI Having a bad sore throat--started a week ago Hurts to swallow Neck feels swollen Cough with drainage--slight sputum Ears are congested  No fever but felt hot and had night sweat the other night No real SOB  Has tried cold meds---no help  Current Outpatient Prescriptions on File Prior to Visit  Medication Sig Dispense Refill  . chlorthalidone (HYGROTON) 25 MG tablet Take 1 tablet (25 mg total) by mouth daily.  30 tablet  5  . gabapentin (NEURONTIN) 300 MG capsule Take 1 capsule (300 mg total) by mouth 3 (three) times daily. You can add a second tablet in the pm and am,  150 capsule  3  . ibuprofen (ADVIL,MOTRIN) 800 MG tablet Take 800 mg by mouth 3 (three) times daily.      . methocarbamol (ROBAXIN) 500 MG tablet Take 1 tablet (500 mg total) by mouth 3 (three) times daily.  90 tablet  2   No current facility-administered medications on file prior to visit.    Allergies  Allergen Reactions  . Fluoxetine Hcl     REACTION: paranoia  . Methocarbamol     Rash  . Sertraline Hcl     REACTION: didn`t tolerate    Past Medical History  Diagnosis Date  . Anxiety   . Depression   . Hypertension     Past Surgical History  Procedure Laterality Date  . Hip arthroscopy  [id number]    right hip  . Joint replacement  2009    Right THR-- [name]  . Femur bone graft  2011    Cadaver-- [facility] Mickle [facility]umb [facility] History  Problem Relation Age of Onset  . Heart disease Father   . Birth defects Other   . Heart disease Other   . Diabetes Other   . Hypertension Other     History   Social History  . Marital Status: Legally Separated    Spouse Name: N/A    Number of Children: 2  . Years of Education: N/A   Occupational History  . Disabled since 2012     from hip   Social History Main Topics  . Smoking status: Current Every Day Smoker  . Smokeless tobacco: Never Used  .  Alcohol Use: Yes  . Drug Use: No  . Sexual Activity: Not on file   Other Topics Concern  . Not on file   Social History Narrative   Legally seperated since ~2009      2 children with their mother   Review of Systems No significant rash No vomiting or diarrhea Appetite is off     Objective:   Physical Exam  Constitutional: He appears well-developed. No distress.  Frequent coarse cough Appears mildly ill  HENT:  No sinus tenderness Mild pharyngeal injection without exudates TMs normal Moderate nasal congestion and inflammation  Neck: Normal range of motion. Neck supple. No thyromegaly present.  Moderately tender bilat ant cervical nodes  Pulmonary/Chest: Effort normal and breath sounds normal. No respiratory distress. He has no wheezes. He has no rales.  Lymphadenopathy:    He has cervical adenopathy.  Skin: No rash noted.          Assessment & Plan:

## 2012-09-22 NOTE — Assessment & Plan Note (Signed)
Sick over a week and worsening Tender nodes Will treat with augmentin and benzonatate

## 2012-09-22 NOTE — Assessment & Plan Note (Signed)
BP Readings from Last 3 Encounters:  09/22/12 128/94  08/10/12 148/100  08/08/12 151/109   Better on Rx

## 2012-09-26 ENCOUNTER — Encounter
Payer: Medicare Other | Attending: Physical Medicine and Rehabilitation | Admitting: Physical Medicine and Rehabilitation

## 2012-09-26 ENCOUNTER — Telehealth: Payer: Self-pay | Admitting: *Deleted

## 2012-09-26 NOTE — Telephone Encounter (Signed)
Per fax from pharmacy, BENZONATATE is not covered by the pt's insurance plan, they request an alternative. Spoke with pharmacist and she states Medicare doesn't cover any cough meds, and this costs $37.77. Please advise

## 2012-09-26 NOTE — Telephone Encounter (Signed)
He probably just needs to stick with OTC like DM cough meds

## 2012-09-27 NOTE — Telephone Encounter (Signed)
Left message on machine with results, advised mom or pt to call if any questions

## 2012-09-27 NOTE — Telephone Encounter (Signed)
.  left message to have patient return my call.  

## 2012-09-30 ENCOUNTER — Encounter
Payer: Medicare Other | Attending: Physical Medicine and Rehabilitation | Admitting: Physical Medicine and Rehabilitation

## 2012-10-16 ENCOUNTER — Encounter (HOSPITAL_COMMUNITY): Payer: Self-pay | Admitting: *Deleted

## 2012-10-16 ENCOUNTER — Emergency Department (HOSPITAL_COMMUNITY)
Admission: EM | Admit: 2012-10-16 | Discharge: 2012-10-16 | Disposition: A | Discharge status: Home / Self Care | Payer: Medicare Other | Attending: Emergency Medicine | Admitting: Emergency Medicine

## 2012-10-16 DIAGNOSIS — F17200 Nicotine dependence, unspecified, uncomplicated: Secondary | ICD-10-CM | POA: Diagnosis not present

## 2012-10-16 DIAGNOSIS — Z79899 Other long term (current) drug therapy: Secondary | ICD-10-CM | POA: Insufficient documentation

## 2012-10-16 DIAGNOSIS — F329 Major depressive disorder, single episode, unspecified: Secondary | ICD-10-CM | POA: Diagnosis not present

## 2012-10-16 DIAGNOSIS — I10 Essential (primary) hypertension: Secondary | ICD-10-CM | POA: Insufficient documentation

## 2012-10-16 DIAGNOSIS — F411 Generalized anxiety disorder: Secondary | ICD-10-CM | POA: Insufficient documentation

## 2012-10-16 DIAGNOSIS — T6391XA Toxic effect of contact with unspecified venomous animal, accidental (unintentional), initial encounter: Secondary | ICD-10-CM | POA: Insufficient documentation

## 2012-10-16 DIAGNOSIS — R1310 Dysphagia, unspecified: Secondary | ICD-10-CM | POA: Diagnosis not present

## 2012-10-16 DIAGNOSIS — Z91030 Bee allergy status: Secondary | ICD-10-CM

## 2012-10-16 DIAGNOSIS — Y939 Activity, unspecified: Secondary | ICD-10-CM | POA: Insufficient documentation

## 2012-10-16 DIAGNOSIS — F3289 Other specified depressive episodes: Secondary | ICD-10-CM | POA: Insufficient documentation

## 2012-10-16 DIAGNOSIS — Y929 Unspecified place or not applicable: Secondary | ICD-10-CM | POA: Insufficient documentation

## 2012-10-16 DIAGNOSIS — IMO0002 Reserved for concepts with insufficient information to code with codable children: Secondary | ICD-10-CM | POA: Diagnosis not present

## 2012-10-16 DIAGNOSIS — T63461A Toxic effect of venom of wasps, accidental (unintentional), initial encounter: Secondary | ICD-10-CM | POA: Insufficient documentation

## 2012-10-16 MED ORDER — FAMOTIDINE IN NACL 20-0.9 MG/50ML-% IV SOLN
20.0000 mg | Freq: Two times a day (BID) | INTRAVENOUS | Status: DC
  Administered 2012-10-16: 20 mg via INTRAVENOUS
  Filled 2012-10-16: qty 50

## 2012-10-16 MED ORDER — DIPHENHYDRAMINE HCL 50 MG/ML IJ SOLN
25.0000 mg | Freq: Once | INTRAMUSCULAR | Status: AC
  Administered 2012-10-16: 25 mg via INTRAVENOUS
  Filled 2012-10-16: qty 1

## 2012-10-16 MED ORDER — SODIUM CHLORIDE 0.9 % IV SOLN
1000.0000 mL | INTRAVENOUS | Status: DC
  Administered 2012-10-16: 1000 mL via INTRAVENOUS

## 2012-10-16 MED ORDER — EPINEPHRINE 0.3 MG/0.3ML IJ SOAJ: 0.3000 mg | INTRAMUSCULAR | Status: DC | PRN

## 2012-10-16 MED ORDER — DIPHENHYDRAMINE HCL 25 MG PO TABS: 25.0000 mg | ORAL_TABLET | Freq: Four times a day (QID) | ORAL | Status: DC | PRN

## 2012-10-16 MED ORDER — METHYLPREDNISOLONE SODIUM SUCC 125 MG IJ SOLR
125.0000 mg | Freq: Four times a day (QID) | INTRAMUSCULAR | Status: DC
  Administered 2012-10-16: 125 mg via INTRAVENOUS
  Filled 2012-10-16: qty 2

## 2012-10-16 MED ORDER — DIPHENHYDRAMINE HCL 50 MG/ML IJ SOLN
50.0000 mg | Freq: Once | INTRAMUSCULAR | Status: AC
  Administered 2012-10-16: 50 mg via INTRAMUSCULAR

## 2012-10-16 MED ORDER — EPINEPHRINE 0.3 MG/0.3ML IJ SOAJ
0.3000 mg | Freq: Once | INTRAMUSCULAR | Status: AC
  Administered 2012-10-16: 0.3 mg via INTRAMUSCULAR
  Filled 2012-10-16: qty 0.3

## 2012-10-16 MED ORDER — PREDNISONE 50 MG PO TABS: 50.0000 mg | ORAL_TABLET | Freq: Every day | ORAL | Status: DC

## 2012-10-16 NOTE — ED Notes (Signed)
Brought pt back to room in wheelchair.  Pt is anxious and feels that his throat may be swelling more.  No further wheezing heard.  VS wnl on arrival to room.  RN at bedside with pt

## 2012-10-16 NOTE — ED Provider Notes (Signed)
CSN: [id number]     Arrival date & time 10/16/12  1236 History  First MD Initiated Contact with Patient 10/16/12 1255     Chief Complaint  Patient presents with  . Allergic Reaction   Patient is a 37 y.o. male presenting with allergic reaction. The history is provided by the patient.  Allergic Reaction Presenting symptoms: difficulty swallowing   Presenting symptoms: no difficulty breathing, no itching and no wheezing   Presenting symptoms comment:  Pt feels as if his throat is swelling  Severity:  Moderate Context comment:  Bee sting, known bee sting allergy, epipen is out of date Relieved by:  Nothing Worsened by:  Nothing tried Ineffective treatments:  None tried   Past Medical History  Diagnosis Date  . Anxiety   . Depression   . Hypertension    Past Surgical History  Procedure Laterality Date  . Hip arthroscopy  [id number]    right hip  . Joint replacement  2009    Right THR-- [name]  . Femur bone graft  2011    Cadaver-- Dr Mickle Plumb at [facility] History  Problem Relation Age of Onset  . Heart disease Father   . Birth defects Other   . Heart disease Other   . Diabetes Other   . Hypertension Other    History  Substance Use Topics  . Smoking status: Current Every Day Smoker  . Smokeless tobacco: Never Used  . Alcohol Use: Yes    Review of Systems  HENT: Positive for trouble swallowing.   Respiratory: Negative for wheezing.   Skin: Negative for itching.  All other systems reviewed and are negative.    Allergies  Bee venom; Fluoxetine hcl; Methocarbamol; and Sertraline hcl  Home Medications   Current Outpatient Rx  Name  Route  Sig  Dispense  Refill  . chlorthalidone (HYGROTON) 25 MG tablet   Oral   Take 25 mg by mouth daily.         Marland Kitchen gabapentin (NEURONTIN) 300 MG capsule   Oral   Take 600 mg by mouth 3 (three) times daily.         Marland Kitchen ibuprofen (ADVIL,MOTRIN) 800 MG tablet   Oral   Take 800 mg by mouth 3 (three) times daily as needed for  pain.          . methocarbamol (ROBAXIN) 500 MG tablet   Oral   Take 500 mg by mouth 3 (three) times daily.         . diphenhydrAMINE (BENADRYL) 25 MG tablet   Oral   Take 1-2 tablets (25-50 mg total) by mouth every 6 (six) hours as needed for itching or allergies.   30 tablet   0   . EPINEPHrine (EPIPEN) 0.3 mg/0.3 mL SOAJ injection   Intramuscular   Inject 0.3 mLs (0.3 mg total) into the muscle as needed.   1 Device   1   . predniSONE (DELTASONE) 50 MG tablet   Oral   Take 1 tablet (50 mg total) by mouth daily.   5 tablet   0     Dispense as written.    BP 114/75  Pulse 60  Temp(Src) 97.9 F (36.6 C) (Oral)  Resp 18  SpO2 96% Physical Exam  Nursing note and vitals reviewed. Constitutional: He appears well-nourished. No distress.  HENT:  Head: Normocephalic and atraumatic.  Right Ear: External ear normal.  Left Ear: External ear normal.  Eyes: Conjunctivae are normal. Right eye exhibits no discharge.  Left eye exhibits no discharge. No scleral icterus.  Neck: Neck supple. No tracheal deviation present.  Cardiovascular: Normal rate, regular rhythm and intact distal pulses.   Pulmonary/Chest: Effort normal and breath sounds normal. No stridor. No respiratory distress. He has no wheezes. He has no rales.  Abdominal: Soft. Bowel sounds are normal. He exhibits no distension. There is no tenderness. There is no rebound and no guarding.  Musculoskeletal: He exhibits no edema and no tenderness.  Neurological: He is alert. He has normal strength. No sensory deficit. Cranial nerve deficit:  no gross defecits noted. He exhibits normal muscle tone. He displays no seizure activity. Coordination normal.  Skin: Skin is warm and dry. No rash noted.  Single urticarial lesion right scapula region where he was stung, no retained stinger  Psychiatric: His mood appears anxious.    ED Course  Procedures (including critical care time) Labs Review Labs Reviewed - No data to  display Imaging Review No results found.  Medications  famotidine (PEPCID) IVPB 20 mg (0 mg Intravenous Paused 10/16/12 1414)  methylPREDNISolone sodium succinate (SOLU-MEDROL) 125 mg/2 mL injection 125 mg (125 mg Intravenous Given 10/16/12 1308)  0.9 %  sodium chloride infusion (1,000 mLs Intravenous New Bag/Given 10/16/12 1309)  diphenhydrAMINE (BENADRYL) injection 50 mg (50 mg Intramuscular Given 10/16/12 1244)  diphenhydrAMINE (BENADRYL) injection 25 mg (25 mg Intravenous Given 10/16/12 1307)  EPINEPHrine (EPI-PEN) injection 0.3 mg (0.3 mg Intramuscular Given 10/16/12 1309)   1516  Pt feels fine.  Asymptomatic.  Much improved after treatment. 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  Patient was treated in the emergency room. He remained asymptomatic.  He was monitored for a few hours. I will discharge him home on a course of steroids, antihistamines and I have refilled his EpiPen.   Celene Kras, MD 10/16/12 [phone]

## 2012-10-16 NOTE — ED Notes (Signed)
Pt was stung by bee and he is allergic.  Pt arrives with mild sob, states that he feels that his throat is closing up.  Slight wheezing heard on arrival.  Pt brought back and given benadryl IM in triage

## 2012-12-14 ENCOUNTER — Encounter (HOSPITAL_COMMUNITY): Payer: Self-pay | Admitting: Emergency Medicine

## 2012-12-14 ENCOUNTER — Emergency Department (INDEPENDENT_AMBULATORY_CARE_PROVIDER_SITE_OTHER)
Admission: EM | Admit: 2012-12-14 | Discharge: 2012-12-14 | Disposition: A | Discharge status: Home / Self Care | Payer: Medicare Other | Source: Home / Self Care

## 2012-12-14 DIAGNOSIS — H109 Unspecified conjunctivitis: Secondary | ICD-10-CM

## 2012-12-14 MED ORDER — POLYMYXIN B-TRIMETHOPRIM 10000-0.1 UNIT/ML-% OP SOLN: 1.0000 [drp] | OPHTHALMIC | Status: DC

## 2012-12-14 MED ORDER — DEXAMETHASONE SODIUM PHOSPHATE 0.1 % OP SOLN: 1.0000 [drp] | OPHTHALMIC | Status: DC

## 2012-12-14 NOTE — ED Provider Notes (Signed)
Medical screening examination/treatment/procedure(s) were performed by non-physician practitioner and as supervising physician I was immediately available for consultation/collaboration.  Leslee Home, M.D.  Reuben Likes, MD 12/14/12 [phone]

## 2012-12-14 NOTE — ED Notes (Signed)
Pt  Has   irritatated  l  Eye      With  Some  Swelling to the  Inner  Canthus  He  denys  Any  Known  Injury or  Any  Known fb

## 2012-12-14 NOTE — Discharge Instructions (Signed)

## 2012-12-14 NOTE — ED Provider Notes (Signed)
CSN: [id number]     Arrival date & time 12/14/12  1604 History   First MD Initiated Contact with Patient 12/14/12 1645     Chief Complaint  Patient presents with  . Eye Problem   (Consider location/radiation/quality/duration/timing/severity/associated sxs/prior Treatment) HPI Comments: And 37 year old male developed mild itching to the inner canthus of the left eye this afternoon. This continued to worsen as well as turned red with light clear watering. He denies similar symptoms to the right eye except for the fact it is mildly itchy. Denies foreign body sensation or known injury. The onset was insidious gradual.   Past Medical History  Diagnosis Date  . Anxiety   . Depression   . Hypertension    Past Surgical History  Procedure Laterality Date  . Hip arthroscopy  [id number]    right hip  . Joint replacement  2009    Right THR-- [name]  . Femur bone graft  2011    Cadaver-- Dr Mickle Plumb at [facility] History  Problem Relation Age of Onset  . Heart disease Father   . Birth defects Other   . Heart disease Other   . Diabetes Other   . Hypertension Other    History  Substance Use Topics  . Smoking status: Current Every Day Smoker  . Smokeless tobacco: Never Used  . Alcohol Use: Yes    Review of Systems  Constitutional: Negative.   HENT: Negative.   Eyes: Positive for discharge, redness and itching. Negative for photophobia, pain and visual disturbance.  Respiratory: Negative.   All other systems reviewed and are negative.    Allergies  Bee venom; Fluoxetine hcl; Methocarbamol; and Sertraline hcl  Home Medications   Current Outpatient Rx  Name  Route  Sig  Dispense  Refill  . chlorthalidone (HYGROTON) 25 MG tablet   Oral   Take 25 mg by mouth daily.         Marland Kitchen dexamethasone (DECADRON) 0.1 % ophthalmic solution   Both Eyes   Place 1 drop into both eyes every 4 (four) hours.   5 mL   0   . diphenhydrAMINE (BENADRYL) 25 MG tablet   Oral   Take 1-2  tablets (25-50 mg total) by mouth every 6 (six) hours as needed for itching or allergies.   30 tablet   0   . EPINEPHrine (EPIPEN) 0.3 mg/0.3 mL SOAJ injection   Intramuscular   Inject 0.3 mLs (0.3 mg total) into the muscle as needed.   1 Device   1   . gabapentin (NEURONTIN) 300 MG capsule   Oral   Take 600 mg by mouth 3 (three) times daily.         Marland Kitchen ibuprofen (ADVIL,MOTRIN) 800 MG tablet   Oral   Take 800 mg by mouth 3 (three) times daily as needed for pain.          . methocarbamol (ROBAXIN) 500 MG tablet   Oral   Take 500 mg by mouth 3 (three) times daily.         . predniSONE (DELTASONE) 50 MG tablet   Oral   Take 1 tablet (50 mg total) by mouth daily.   5 tablet   0     Dispense as written.   . trimethoprim-polymyxin b (POLYTRIM) ophthalmic solution   Both Eyes   Place 1 drop into both eyes every 4 (four) hours.   10 mL   0    BP 138/90  Pulse 74  Temp(Src)  98.7 F (37.1 C) (Oral)  Resp 18  SpO2 96% Physical Exam  Nursing note and vitals reviewed. Constitutional: He is oriented to person, place, and time. He appears well-developed and well-nourished. No distress.  HENT:  Mouth/Throat: Oropharynx is clear and moist.  Eyes: EOM are normal. Pupils are equal, round, and reactive to light.  Examination the left eye reveals erythema to the malleolar. Erythematous greatest over the inner canthus and there is minimal swelling and erythema to the nasal lacrimal duct. The duct does not appear obstructed or associated with a cyst or abscess formation. Right eye with lesser erythema to the lid. No scleral erythema.  Neck: Normal range of motion. Neck supple.  Pulmonary/Chest: Effort normal. No respiratory distress.  Lymphadenopathy:    He has no cervical adenopathy.  Neurological: He is alert and oriented to person, place, and time.  Skin: Skin is warm and dry.  Psychiatric: He has a normal mood and affect.    ED Course  Procedures (including critical  care time) Labs Review Labs Reviewed - No data to display Imaging Review No results found.    MDM   1. Conjunctivitis of both eyes      polytrim q 4 h ou Dexamethasone op 1 gtt ou qid For worsening recheck promptly.  Hayden Rasmussen, NP 12/14/12 1711

## 2013-02-13 DIAGNOSIS — G8929 Other chronic pain: Secondary | ICD-10-CM | POA: Diagnosis not present

## 2013-02-13 DIAGNOSIS — Z96649 Presence of unspecified artificial hip joint: Secondary | ICD-10-CM | POA: Diagnosis not present

## 2013-02-13 DIAGNOSIS — F17200 Nicotine dependence, unspecified, uncomplicated: Secondary | ICD-10-CM | POA: Diagnosis not present

## 2013-02-13 DIAGNOSIS — Z5189 Encounter for other specified aftercare: Secondary | ICD-10-CM | POA: Diagnosis not present

## 2013-02-13 DIAGNOSIS — IMO0002 Reserved for concepts with insufficient information to code with codable children: Secondary | ICD-10-CM | POA: Diagnosis not present

## 2013-02-13 DIAGNOSIS — M25569 Pain in unspecified knee: Secondary | ICD-10-CM | POA: Diagnosis not present

## 2013-02-13 DIAGNOSIS — M79609 Pain in unspecified limb: Secondary | ICD-10-CM | POA: Diagnosis not present

## 2013-03-27 ENCOUNTER — Encounter: Payer: Self-pay | Admitting: Internal Medicine

## 2013-03-27 ENCOUNTER — Ambulatory Visit (INDEPENDENT_AMBULATORY_CARE_PROVIDER_SITE_OTHER): Payer: Medicare Other | Admitting: Internal Medicine

## 2013-03-27 VITALS — BP 110/70 | HR 88 | Temp 98.0°F | Wt 214.0 lb

## 2013-03-27 DIAGNOSIS — M25519 Pain in unspecified shoulder: Secondary | ICD-10-CM | POA: Diagnosis not present

## 2013-03-27 DIAGNOSIS — Z87891 Personal history of nicotine dependence: Secondary | ICD-10-CM | POA: Diagnosis not present

## 2013-03-27 DIAGNOSIS — F410 Panic disorder [episodic paroxysmal anxiety] without agoraphobia: Secondary | ICD-10-CM | POA: Diagnosis not present

## 2013-03-27 DIAGNOSIS — G894 Chronic pain syndrome: Secondary | ICD-10-CM

## 2013-03-27 DIAGNOSIS — I10 Essential (primary) hypertension: Secondary | ICD-10-CM

## 2013-03-27 DIAGNOSIS — M25512 Pain in left shoulder: Secondary | ICD-10-CM | POA: Insufficient documentation

## 2013-03-27 DIAGNOSIS — M25559 Pain in unspecified hip: Secondary | ICD-10-CM | POA: Diagnosis not present

## 2013-03-27 DIAGNOSIS — Z96649 Presence of unspecified artificial hip joint: Secondary | ICD-10-CM | POA: Diagnosis not present

## 2013-03-27 DIAGNOSIS — M25569 Pain in unspecified knee: Secondary | ICD-10-CM | POA: Diagnosis not present

## 2013-03-27 MED ORDER — DIAZEPAM 5 MG PO TABS
2.5000 mg | ORAL_TABLET | Freq: Two times a day (BID) | ORAL | Status: DC | PRN
Start: 2013-03-27 — End: 2013-05-08

## 2013-03-27 MED ORDER — CHLORTHALIDONE 25 MG PO TABS: 25.0000 mg | ORAL_TABLET | Freq: Every day | ORAL | Status: AC

## 2013-03-27 NOTE — Progress Notes (Signed)
Pre visit review using our clinic review tool, if applicable. No additional management support is needed unless otherwise documented below in the visit note. 

## 2013-03-27 NOTE — Assessment & Plan Note (Signed)
Will give Rx for diazepam for rare use Have suggested that #30 should last at least 2 months

## 2013-03-27 NOTE — Assessment & Plan Note (Signed)
[facility] being [facility] No meds for now

## 2013-03-27 NOTE — Progress Notes (Signed)
Subjective:    Patient ID: [name], male    DOB: May 14, 1975, 38 y.o.   MRN: [id number]  HPI Doing okay  Ongoing pain issues Now expects to get his knee replaced Has [facility] Had been on gabapentin but this wasn't helping and increased his appetite  Gets panic attacks when out among people Gets SOB and shaky Took valium with good results in the past  Depression overall is better Trying to get out more--then he sometimes has the panic  Ongoing left shoulder pain Torn rotator cuff in the past--- 5-6 years ago Can abduct to shoulder height  Current Outpatient Prescriptions on File Prior to Visit  Medication Sig Dispense Refill  . chlorthalidone (HYGROTON) 25 MG tablet Take 25 mg by mouth daily.      Marland Kitchen. EPINEPHrine (EPIPEN) 0.3 mg/0.3 mL SOAJ injection Inject 0.3 mLs (0.3 mg total) into the muscle as needed.  1 Device  1  . ibuprofen (ADVIL,MOTRIN) 800 MG tablet Take 800 mg by mouth 3 (three) times daily as needed for pain.        No current facility-administered medications on file prior to visit.    Allergies  Allergen Reactions  . Bee Venom Anaphylaxis  . Fluoxetine Hcl     REACTION: paranoia  . Methocarbamol     Rash  . Sertraline Hcl     REACTION: didn`t tolerate    Past Medical History  Diagnosis Date  . Anxiety   . Depression   . Hypertension     Past Surgical History  Procedure Laterality Date  . Hip arthroscopy  [id number]    right hip  . Joint replacement  2009    Right THR-- [name]  . Femur bone graft  2011    [name]    Family History  Problem Relation Age of Onset  . Heart disease Father   . Birth defects Other   . Heart disease Other   . Diabetes Other   . Hypertension Other     History   Social History  . Marital Status: Legally Separated    Spouse Name: N/A    Number of Children: 2  . Years of Education: N/A   Occupational History  . Disabled since 2012     from hip    Social History Main Topics  . Smoking status: Current Every Day Smoker  . Smokeless tobacco: Never Used  . Alcohol Use: Yes  . Drug Use: No  . Sexual Activity: Not on file   Other Topics Concern  . Not on file   Social History Narrative   Legally seperated since ~2009      2 children with their mother   Review of Systems Sleep is still variable Still may be tired in day--no energy Conjunctivitis is okay Appetite is okay---weight is up some    Objective:   Physical Exam  Constitutional: He appears well-developed and well-nourished. No distress.  Neck: Normal range of motion. No thyromegaly present.  Cardiovascular: Normal rate, regular rhythm and normal heart sounds.  Exam reveals no gallop.   No murmur heard. Pulmonary/Chest: Effort normal and breath sounds normal. No respiratory distress. He has no wheezes. He has no rales.  Musculoskeletal: He exhibits no edema.  Left shoulder ---some crepitus Active abduction to 90 degrees Modest restriction in internal/external rotation   Lymphadenopathy:    He has no cervical adenopathy.  Psychiatric:  Neutral mood Normal appearance and speech  Assessment & Plan:

## 2013-03-27 NOTE — Assessment & Plan Note (Signed)
BP Readings from Last 3 Encounters:  03/27/13 110/70  12/14/12 138/90  10/16/12 114/75   Good control No changes now

## 2013-03-27 NOTE — Assessment & Plan Note (Signed)
Seems like a strain Not a tear of rotator cuff Some underlying arthritis there also?

## 2013-03-28 ENCOUNTER — Telehealth: Payer: Self-pay | Admitting: Internal Medicine

## 2013-03-28 NOTE — Telephone Encounter (Signed)
Relevant patient education mailed to patient.  

## 2013-04-11 ENCOUNTER — Telehealth: Payer: Self-pay | Admitting: Internal Medicine

## 2013-04-11 NOTE — Telephone Encounter (Signed)
Relevant patient education mailed to patient.  

## 2013-04-14 DIAGNOSIS — F17200 Nicotine dependence, unspecified, uncomplicated: Secondary | ICD-10-CM | POA: Diagnosis not present

## 2013-04-14 DIAGNOSIS — M545 Low back pain, unspecified: Secondary | ICD-10-CM | POA: Diagnosis not present

## 2013-04-14 DIAGNOSIS — G8929 Other chronic pain: Secondary | ICD-10-CM | POA: Diagnosis not present

## 2013-04-14 DIAGNOSIS — L905 Scar conditions and fibrosis of skin: Secondary | ICD-10-CM | POA: Diagnosis not present

## 2013-04-14 DIAGNOSIS — M25569 Pain in unspecified knee: Secondary | ICD-10-CM | POA: Diagnosis not present

## 2013-05-08 ENCOUNTER — Other Ambulatory Visit: Payer: Self-pay | Admitting: Internal Medicine

## 2013-05-08 NOTE — Telephone Encounter (Signed)
03/27/13

## 2013-05-08 NOTE — Telephone Encounter (Signed)
rx called into pharmacy

## 2013-05-08 NOTE — Telephone Encounter (Signed)
Okay #30 x 0 

## 2013-05-11 DIAGNOSIS — M5137 Other intervertebral disc degeneration, lumbosacral region: Secondary | ICD-10-CM | POA: Diagnosis not present

## 2013-05-11 DIAGNOSIS — M545 Low back pain, unspecified: Secondary | ICD-10-CM | POA: Diagnosis not present

## 2013-05-11 DIAGNOSIS — M47817 Spondylosis without myelopathy or radiculopathy, lumbosacral region: Secondary | ICD-10-CM | POA: Diagnosis not present

## 2013-05-11 DIAGNOSIS — G8929 Other chronic pain: Secondary | ICD-10-CM | POA: Diagnosis not present

## 2013-06-01 DIAGNOSIS — G44209 Tension-type headache, unspecified, not intractable: Secondary | ICD-10-CM | POA: Diagnosis not present

## 2013-06-01 DIAGNOSIS — H16229 Keratoconjunctivitis sicca, not specified as Sjogren's, unspecified eye: Secondary | ICD-10-CM | POA: Diagnosis not present

## 2013-06-08 ENCOUNTER — Other Ambulatory Visit: Payer: Self-pay | Admitting: Internal Medicine

## 2013-06-08 NOTE — Telephone Encounter (Signed)
05/08/13 

## 2013-06-08 NOTE — Telephone Encounter (Signed)
Okay #30 x 0 

## 2013-06-09 NOTE — Telephone Encounter (Signed)
rx called into pharmacy

## 2013-07-10 ENCOUNTER — Other Ambulatory Visit: Payer: Self-pay | Admitting: Internal Medicine

## 2013-07-10 NOTE — Telephone Encounter (Signed)
Okay #30 x 0 

## 2013-07-10 NOTE — Telephone Encounter (Signed)
rx called into pharmacy

## 2013-08-17 ENCOUNTER — Other Ambulatory Visit: Payer: Self-pay | Admitting: Internal Medicine

## 2013-08-17 NOTE — Telephone Encounter (Signed)
Electronic refill request, please advise  

## 2013-08-17 NOTE — Telephone Encounter (Signed)
Okay #30 x 0 

## 2013-08-17 NOTE — Telephone Encounter (Signed)
Rx called in as prescribed 

## 2013-09-12 ENCOUNTER — Other Ambulatory Visit: Payer: Self-pay | Admitting: Internal Medicine

## 2013-09-12 NOTE — Telephone Encounter (Signed)
08/17/13 

## 2013-09-12 NOTE — Telephone Encounter (Signed)
Okay #30 x 0 

## 2013-09-12 NOTE — Telephone Encounter (Signed)
rx called into pharmacy

## 2013-09-18 ENCOUNTER — Ambulatory Visit: Payer: Medicare Other | Admitting: Internal Medicine

## 2013-09-18 DIAGNOSIS — Z0289 Encounter for other administrative examinations: Secondary | ICD-10-CM

## 2013-09-25 ENCOUNTER — Ambulatory Visit: Payer: Medicare Other | Admitting: Internal Medicine

## 2013-10-11 ENCOUNTER — Other Ambulatory Visit: Payer: Self-pay | Admitting: Internal Medicine

## 2013-10-11 NOTE — Telephone Encounter (Signed)
rx called into pharmacy .left message to have patient return my call.  

## 2013-10-11 NOTE — Telephone Encounter (Signed)
Okay #30 x 0 Remind him the 30 is supposed to last 2 months Needs to reschedule his follow up

## 2013-10-11 NOTE — Telephone Encounter (Signed)
Scheduled 10/20/13

## 2013-10-11 NOTE — Telephone Encounter (Signed)
09/12/13 

## 2013-10-16 DIAGNOSIS — M47817 Spondylosis without myelopathy or radiculopathy, lumbosacral region: Secondary | ICD-10-CM | POA: Diagnosis not present

## 2013-10-16 DIAGNOSIS — M25569 Pain in unspecified knee: Secondary | ICD-10-CM | POA: Diagnosis not present

## 2013-10-16 DIAGNOSIS — M5137 Other intervertebral disc degeneration, lumbosacral region: Secondary | ICD-10-CM | POA: Diagnosis not present

## 2013-10-16 DIAGNOSIS — G894 Chronic pain syndrome: Secondary | ICD-10-CM | POA: Diagnosis not present

## 2013-10-20 ENCOUNTER — Ambulatory Visit: Payer: Medicare Other | Admitting: Internal Medicine

## 2013-10-27 DIAGNOSIS — M25569 Pain in unspecified knee: Secondary | ICD-10-CM | POA: Diagnosis not present

## 2013-10-27 DIAGNOSIS — M545 Low back pain, unspecified: Secondary | ICD-10-CM | POA: Diagnosis not present

## 2013-10-27 DIAGNOSIS — G8929 Other chronic pain: Secondary | ICD-10-CM | POA: Diagnosis not present

## 2013-11-03 ENCOUNTER — Encounter: Payer: Self-pay | Admitting: Internal Medicine

## 2013-11-03 ENCOUNTER — Ambulatory Visit (INDEPENDENT_AMBULATORY_CARE_PROVIDER_SITE_OTHER): Payer: Medicare Other | Admitting: Internal Medicine

## 2013-11-03 ENCOUNTER — Telehealth: Payer: Self-pay | Admitting: *Deleted

## 2013-11-03 VITALS — BP 142/96 | HR 62 | Temp 98.1°F | Wt 217.0 lb

## 2013-11-03 DIAGNOSIS — G894 Chronic pain syndrome: Secondary | ICD-10-CM

## 2013-11-03 DIAGNOSIS — I10 Essential (primary) hypertension: Secondary | ICD-10-CM | POA: Diagnosis not present

## 2013-11-03 DIAGNOSIS — F410 Panic disorder [episodic paroxysmal anxiety] without agoraphobia: Secondary | ICD-10-CM | POA: Diagnosis not present

## 2013-11-03 DIAGNOSIS — R0602 Shortness of breath: Secondary | ICD-10-CM | POA: Insufficient documentation

## 2013-11-03 DIAGNOSIS — J449 Chronic obstructive pulmonary disease, unspecified: Secondary | ICD-10-CM | POA: Diagnosis not present

## 2013-11-03 DIAGNOSIS — Z9889 Other specified postprocedural states: Secondary | ICD-10-CM

## 2013-11-03 DIAGNOSIS — Z209 Contact with and (suspected) exposure to unspecified communicable disease: Secondary | ICD-10-CM

## 2013-11-03 NOTE — Telephone Encounter (Signed)
Pt didn't go to the lab for his lab work or UDS, Per Dr. Alphonsus SiasLetvak, I called pt and advise him that since he didn't do his UDS that Dr. Alphonsus SiasLetvak will not prescribe his valium anymore. Pt said he had to leave due to his mom having to go to work, Per Dr. Alphonsus SiasLetvak I advise pt that if he can come back before we close today to do his UDS he will prescribe it but if not he will not prescribe the medication an more. Pt verbalized understanding and said he will try to come back today but he doesn't drive and doesn't think he will have a ride

## 2013-11-03 NOTE — Telephone Encounter (Signed)
Check with Terri/Tasha and pt never came back for his UDS today

## 2013-11-03 NOTE — Assessment & Plan Note (Addendum)
Probably multifactorial Unable to exercise due to his pain--needs to work on this Spirometry shows mild obstructive changes---needs to stop smoking (though his nerves are making it hard)

## 2013-11-03 NOTE — Progress Notes (Signed)
Subjective:    Patient ID: [name], male    DOB: 03-29-1975, 38 y.o.   MRN: [id number]  HPI Having worse right leg pain Relates to the weather Still goes for pain management at [facility] Due for nerve blocks and cortisone shots for knee and back  Ongoing anxiety Feels the diazepam helps--using more than I hoped Past history of cocaine use---doesn't use any more Discussed that I won't prescribe more if he tests positive Has gotten angry with meds like sertraline  No chest pain Gets sense of SOB at times Worried about COPD due to strong family history Hasn't been able to stop---- he smokes due to his nerves No regular cough--just occasional  Current Outpatient Prescriptions on File Prior to Visit  Medication Sig Dispense Refill  . chlorthalidone (HYGROTON) 25 MG tablet Take 1 tablet (25 mg total) by mouth daily.  90 tablet  3  . diazepam (VALIUM) 5 MG tablet TAKE 1/2 - 1 TABLET BY MOUTH TWICE A DAY AS NEEDED FOR ANXIETY  30 tablet  0  . EPINEPHrine (EPIPEN) 0.3 mg/0.3 mL SOAJ injection Inject 0.3 mLs (0.3 mg total) into the muscle as needed.  1 Device  1  . ibuprofen (ADVIL,MOTRIN) 800 MG tablet Take 800 mg by mouth 3 (three) times daily as needed for pain.        No current facility-administered medications on file prior to visit.    Allergies  Allergen Reactions  . Bee Venom Anaphylaxis  . Fluoxetine Hcl     REACTION: paranoia  . Methocarbamol     Rash  . Sertraline Hcl     REACTION: didn`t tolerate    Past Medical History  Diagnosis Date  . Anxiety   . Depression   . Hypertension     Past Surgical History  Procedure Laterality Date  . Hip arthroscopy  [id number]    right hip  . Joint replacement  2009    Right THR-- [name]  . Femur bone graft  2011    Cadaver-- Dr Mickle Plumb at [facility] History  Problem Relation Age of Onset  . Heart disease Father   . Birth defects Other   . Heart disease Other   . Diabetes Other   . Hypertension Other      History   Social History  . Marital Status: Legally Separated    Spouse Name: N/A    Number of Children: 2  . Years of Education: N/A   Occupational History  . Disabled since 2012     from hip   Social History Main Topics  . Smoking status: Current Every Day Smoker  . Smokeless tobacco: Never Used  . Alcohol Use: Yes     Comment: occ  . Drug Use: No  . Sexual Activity: Not on file   Other Topics Concern  . Not on file   Social History Narrative   Legally seperated since ~2009      2 children with their mother   Review of Systems Appetite is okay Sleeping some better--with the night gabapentin Shoulder pain is better    Objective:   Physical Exam  Constitutional: He appears well-developed and well-nourished. No distress.  Neck: Normal range of motion. Neck supple. No thyromegaly present.  Cardiovascular: Normal rate, regular rhythm and normal heart sounds.  Exam reveals no gallop.   No murmur heard. Pulmonary/Chest: Effort normal. No respiratory distress. He has no wheezes. He has no rales.  Decreased breath sounds but clear  Musculoskeletal: He exhibits no edema and no tenderness.  Lymphadenopathy:    He has no cervical adenopathy.  Psychiatric:  Dysthymic but not overtly depressed  Mildly anxious          Assessment & Plan:

## 2013-11-03 NOTE — Progress Notes (Signed)
Pre visit review using our clinic review tool, if applicable. No additional management support is needed unless otherwise documented below in the visit note. 

## 2013-11-03 NOTE — Patient Instructions (Signed)
Please call 1-800 QUIT NOW for advice about stopping smoking. 

## 2013-11-03 NOTE — Assessment & Plan Note (Signed)
BP Readings from Last 3 Encounters:  11/03/13 142/96  03/27/13 110/70  12/14/12 138/90   Probably okay Didn't take meds today

## 2013-11-03 NOTE — Assessment & Plan Note (Signed)
[facility] note reviewed

## 2013-11-03 NOTE — Assessment & Plan Note (Signed)
Will refill the diazepam monthly if his UDS is negative

## 2013-11-03 NOTE — Telephone Encounter (Signed)
I told him around 4-5 times to get the test today. [facility] so I have to assume his failure to get the test today is because it would be positive again. I will not trust a test later on, since he can withhold the cocaine in anticipation  I will not prescribe the diazepam again unless he does the test today!

## 2013-11-03 NOTE — Assessment & Plan Note (Signed)
Gave info on the quit line

## 2013-11-04 NOTE — Addendum Note (Signed)
Addended by: Tillman AbideLETVAK, Mayuri Staples I on: 11/04/2013 09:07 AM   Modules accepted: Orders

## 2013-11-04 NOTE — Telephone Encounter (Signed)
Please note---no more diazepam

## 2013-11-06 NOTE — Addendum Note (Signed)
Addended by: Alvina ChouWALSH, Ediberto Sens J on: 11/06/2013 12:01 PM   Modules accepted: Orders

## 2013-11-07 ENCOUNTER — Other Ambulatory Visit (INDEPENDENT_AMBULATORY_CARE_PROVIDER_SITE_OTHER): Payer: Medicare Other

## 2013-11-07 ENCOUNTER — Ambulatory Visit: Payer: Medicare Other | Admitting: Internal Medicine

## 2013-11-07 DIAGNOSIS — G894 Chronic pain syndrome: Secondary | ICD-10-CM

## 2013-11-07 DIAGNOSIS — I10 Essential (primary) hypertension: Secondary | ICD-10-CM | POA: Diagnosis not present

## 2013-11-07 DIAGNOSIS — Z209 Contact with and (suspected) exposure to unspecified communicable disease: Secondary | ICD-10-CM

## 2013-11-07 DIAGNOSIS — Z9889 Other specified postprocedural states: Secondary | ICD-10-CM | POA: Diagnosis not present

## 2013-11-07 DIAGNOSIS — F410 Panic disorder [episodic paroxysmal anxiety] without agoraphobia: Secondary | ICD-10-CM | POA: Diagnosis not present

## 2013-11-07 LAB — CBC WITH DIFFERENTIAL/PLATELET
Basophils Absolute: 0.1 10*3/uL (ref 0.0–0.1)
Basophils Relative: 1 % (ref 0.0–3.0)
EOS ABS: 0.3 10*3/uL (ref 0.0–0.7)
Eosinophils Relative: 4.9 % (ref 0.0–5.0)
HCT: 51.9 % (ref 39.0–52.0)
Hemoglobin: 17.7 g/dL — ABNORMAL HIGH (ref 13.0–17.0)
LYMPHS PCT: 34.1 % (ref 12.0–46.0)
Lymphs Abs: 2.3 10*3/uL (ref 0.7–4.0)
MCHC: 34.1 g/dL (ref 30.0–36.0)
MCV: 91.3 fl (ref 78.0–100.0)
MONO ABS: 0.5 10*3/uL (ref 0.1–1.0)
Monocytes Relative: 7.7 % (ref 3.0–12.0)
NEUTROS PCT: 52.3 % (ref 43.0–77.0)
Neutro Abs: 3.6 10*3/uL (ref 1.4–7.7)
PLATELETS: 202 10*3/uL (ref 150.0–400.0)
RBC: 5.68 Mil/uL (ref 4.22–5.81)
RDW: 12.7 % (ref 11.5–15.5)
WBC: 6.9 10*3/uL (ref 4.0–10.5)

## 2013-11-08 LAB — LIPID PANEL
Cholesterol: 258 mg/dL — ABNORMAL HIGH (ref 0–200)
HDL: 34.6 mg/dL — ABNORMAL LOW (ref >39.00)
NONHDL: 223.4
Total CHOL/HDL Ratio: 7
Triglycerides: 266 mg/dL — ABNORMAL HIGH (ref 0.0–149.0)
VLDL: 53.2 mg/dL — ABNORMAL HIGH (ref 0.0–40.0)

## 2013-11-08 LAB — HIV ANTIBODY (ROUTINE TESTING W REFLEX): HIV 1&2 Ab, 4th Generation: NONREACTIVE

## 2013-11-08 LAB — COMPREHENSIVE METABOLIC PANEL
ALBUMIN: 4.4 g/dL (ref 3.5–5.2)
ALK PHOS: 52 U/L (ref 39–117)
ALT: 46 U/L (ref 0–53)
AST: 35 U/L (ref 0–37)
BUN: 14 mg/dL (ref 6–23)
CALCIUM: 9.5 mg/dL (ref 8.4–10.5)
CHLORIDE: 100 meq/L (ref 96–112)
CO2: 27 mEq/L (ref 19–32)
Creatinine, Ser: 1 mg/dL (ref 0.4–1.5)
GFR: 89.46 mL/min (ref >60.00)
GLUCOSE: 95 mg/dL (ref 70–99)
POTASSIUM: 3.9 meq/L (ref 3.5–5.1)
SODIUM: 139 meq/L (ref 135–145)
TOTAL PROTEIN: 8.1 g/dL (ref 6.0–8.3)
Total Bilirubin: 0.9 mg/dL (ref 0.2–1.2)

## 2013-11-08 LAB — LDL CHOLESTEROL, DIRECT: LDL DIRECT: 189.7 mg/dL

## 2013-11-08 LAB — T4, FREE: FREE T4: 0.84 ng/dL (ref 0.60–1.60)

## 2013-11-08 LAB — HEPATITIS C ANTIBODY: HCV AB: REACTIVE — AB

## 2013-11-09 ENCOUNTER — Telehealth: Payer: Self-pay

## 2013-11-09 MED ORDER — SPACER/AERO CHAMBER MOUTHPIECE MISC: Status: DC

## 2013-11-09 MED ORDER — ALBUTEROL SULFATE HFA 108 (90 BASE) MCG/ACT IN AERS: 2.0000 | INHALATION_SPRAY | Freq: Four times a day (QID) | RESPIRATORY_TRACT | Status: DC | PRN

## 2013-11-09 NOTE — Telephone Encounter (Signed)
Pt left v/m; pt was seen 11/03/13 and pt understood may have COPD or beginnings of emphysema. Pt has SOB on and off last night and again this morning. Pt request inhaler sent to [facility] [place]. Now pt not having SOB but when has attack of SOB pt gets anxious. No cough,wheezing or fever.Please advise.

## 2013-11-09 NOTE — Telephone Encounter (Signed)
Patient notified of Dr. Karle StarchLetvak's comments and medication sent to pharmacy. Patient verbalized understanding

## 2013-11-09 NOTE — Telephone Encounter (Signed)
Okay to send rx for albuterol inhaler to try 2 puffs tid prn for SOB He should also get a spacer from the pharmacist and always use the inhaler with the spacer

## 2013-11-17 ENCOUNTER — Other Ambulatory Visit: Payer: Self-pay | Admitting: Internal Medicine

## 2013-11-17 DIAGNOSIS — R768 Other specified abnormal immunological findings in serum: Secondary | ICD-10-CM

## 2013-11-21 ENCOUNTER — Other Ambulatory Visit: Payer: Medicare Other

## 2013-11-21 DIAGNOSIS — R894 Abnormal immunological findings in specimens from other organs, systems and tissues: Secondary | ICD-10-CM | POA: Diagnosis not present

## 2013-11-21 DIAGNOSIS — R768 Other specified abnormal immunological findings in serum: Secondary | ICD-10-CM | POA: Diagnosis not present

## 2013-11-22 LAB — HEPATITIS C RNA QUANTITATIVE: HCV QUANT: NOT DETECTED [IU]/mL (ref <15)

## 2013-11-23 LAB — HEPATITIS C GENOTYPE

## 2013-11-30 DIAGNOSIS — M1288 Other specific arthropathies, not elsewhere classified, other specified site: Secondary | ICD-10-CM | POA: Diagnosis not present

## 2013-11-30 DIAGNOSIS — G894 Chronic pain syndrome: Secondary | ICD-10-CM | POA: Diagnosis not present

## 2013-11-30 DIAGNOSIS — M545 Low back pain: Secondary | ICD-10-CM | POA: Diagnosis not present

## 2013-11-30 DIAGNOSIS — M797 Fibromyalgia: Secondary | ICD-10-CM | POA: Diagnosis not present

## 2013-11-30 DIAGNOSIS — M533 Sacrococcygeal disorders, not elsewhere classified: Secondary | ICD-10-CM | POA: Diagnosis not present

## 2013-11-30 DIAGNOSIS — M461 Sacroiliitis, not elsewhere classified: Secondary | ICD-10-CM | POA: Diagnosis not present

## 2013-12-05 LAB — HEPATITIS C VIRUS FIBROSURE
ALT (SGPT)(02): 39 IU/L (ref ?–55)
Alpha-2-Macroglobulin, Qn: 160 mg/dL (ref 110–276)
Apolipoprotein A-1: 134 mg/dL (ref 110–180)
Bilirubin, total: 0.3 mg/dL (ref 0.0–1.2)
FIBROSIS SCORE(01): 0.09 (ref 0.00–0.21)
GGT(02): 47 IU/L (ref 0–65)
Haptoglobin: 195 mg/dL (ref 34–200)
Necroinflammatory Activity Score: 0.16 (ref 0.00–0.17)

## 2013-12-06 ENCOUNTER — Encounter: Payer: Self-pay | Admitting: *Deleted

## 2013-12-25 DIAGNOSIS — Z96641 Presence of right artificial hip joint: Secondary | ICD-10-CM | POA: Diagnosis not present

## 2013-12-25 DIAGNOSIS — I10 Essential (primary) hypertension: Secondary | ICD-10-CM | POA: Diagnosis not present

## 2013-12-25 DIAGNOSIS — M25561 Pain in right knee: Secondary | ICD-10-CM | POA: Diagnosis not present

## 2013-12-25 DIAGNOSIS — G894 Chronic pain syndrome: Secondary | ICD-10-CM | POA: Diagnosis not present

## 2013-12-25 DIAGNOSIS — L905 Scar conditions and fibrosis of skin: Secondary | ICD-10-CM | POA: Diagnosis not present

## 2014-01-29 DIAGNOSIS — K529 Noninfective gastroenteritis and colitis, unspecified: Secondary | ICD-10-CM | POA: Diagnosis not present

## 2014-02-01 DIAGNOSIS — M545 Low back pain: Secondary | ICD-10-CM | POA: Diagnosis not present

## 2014-02-01 DIAGNOSIS — G894 Chronic pain syndrome: Secondary | ICD-10-CM | POA: Diagnosis not present

## 2014-02-01 DIAGNOSIS — M5137 Other intervertebral disc degeneration, lumbosacral region: Secondary | ICD-10-CM | POA: Diagnosis not present

## 2014-02-11 ENCOUNTER — Other Ambulatory Visit: Payer: Self-pay | Admitting: Internal Medicine

## 2014-02-26 ENCOUNTER — Telehealth: Payer: Self-pay | Admitting: *Deleted

## 2014-02-26 MED ORDER — VARENICLINE TARTRATE 1 MG PO TABS: 1.0000 mg | ORAL_TABLET | Freq: Two times a day (BID) | ORAL | Status: DC

## 2014-02-26 MED ORDER — VARENICLINE TARTRATE 0.5 MG X 11 & 1 MG X 42 PO MISC: 0.5000 mg | Freq: Two times a day (BID) | ORAL | Status: DC

## 2014-02-26 NOTE — Telephone Encounter (Signed)
Let him know I sent the [facility] [place] He should get the starter kit first----then change to the full $Remove'1mg'qDEUPsp$  twice a day for the second Rx Make sure he calls 1-800- QUIT NOW for advice to increase the chance for success Have him let us know if he has any problems with the medication

## 2014-02-26 NOTE — Telephone Encounter (Signed)
Spoke with patient and advised results   

## 2014-02-26 NOTE — Telephone Encounter (Signed)
Pt called stating that he had discussed quitting smoking with you recently. He request rx for chantix be called in. Request cb after it's done.

## 2014-02-27 ENCOUNTER — Other Ambulatory Visit: Payer: Self-pay | Admitting: *Deleted

## 2014-02-27 MED ORDER — ALBUTEROL SULFATE HFA 108 (90 BASE) MCG/ACT IN AERS: 2.0000 | INHALATION_SPRAY | Freq: Four times a day (QID) | RESPIRATORY_TRACT | Status: AC | PRN

## 2014-03-28 ENCOUNTER — Telehealth: Payer: Self-pay

## 2014-03-28 NOTE — Telephone Encounter (Signed)
Prior auth request for chantix.  Patient needs to have tried Bupropion or any type of nicotine patches, gum or lozenge.  Left message for patient to call office to let us know if he has tried any of these.

## 2014-04-09 NOTE — Telephone Encounter (Signed)
Received another request for prior authorization on Chantix. Left another message for patient to call back. Form is on Dee's desktop.

## 2014-04-26 NOTE — Telephone Encounter (Signed)
Left message on cell and home number asking pt to return my call.

## 2014-04-26 NOTE — Telephone Encounter (Signed)
Pt returned D. Smith call re: prior auth on Chantix

## 2014-05-01 NOTE — Telephone Encounter (Signed)
.  left message to have patient return my call.  

## 2014-05-10 DIAGNOSIS — M47817 Spondylosis without myelopathy or radiculopathy, lumbosacral region: Secondary | ICD-10-CM | POA: Diagnosis not present

## 2014-05-15 NOTE — Telephone Encounter (Signed)
If he is willing, okay to Rx bupropion 150mg  Start with 1 daily for 3 days, then go up to bid. Quit date should be about 10 days after starting the med (stop completely) #60 x 4

## 2014-05-15 NOTE — Telephone Encounter (Signed)
Pt walked into the office today, and after I spoke with him he has not tried Wellbutrin or any OTC stop smoking aids. Please advise

## 2014-05-16 MED ORDER — BUPROPION HCL ER (SR) 150 MG PO TB12: 150.0000 mg | ORAL_TABLET | Freq: Two times a day (BID) | ORAL | Status: DC

## 2014-05-16 NOTE — Telephone Encounter (Signed)
rx sent to pharmacy by e-script .left message to have patient return my call. Left detailed message on VM

## 2014-05-16 NOTE — Addendum Note (Signed)
Addended by: Sueanne MargaritaSMITH, DESHANNON L on: 05/16/2014 09:54 AM   Modules accepted: Orders, Medications

## 2014-05-22 ENCOUNTER — Other Ambulatory Visit: Payer: Self-pay | Admitting: Internal Medicine

## 2014-05-22 NOTE — Telephone Encounter (Signed)
See phone note 11/03/13, no controlled substances

## 2014-06-29 ENCOUNTER — Telehealth: Payer: Self-pay

## 2014-06-29 NOTE — Telephone Encounter (Signed)
Pt has problems with knee and has already had hip replacement; [facility] told [facility] he would need to f/u with ortho about knee; pt request referral;advised pt need appt to see Dr Alphonsus SiasLetvak or [facility] could [facility] about [facility]. [facility] and if needed will cb for appt.

## 2014-07-18 ENCOUNTER — Emergency Department (HOSPITAL_COMMUNITY)
Admission: EM | Admit: 2014-07-18 | Discharge: 2014-07-19 | Discharge status: Against Medical Advice | Payer: Medicare Other | Attending: Emergency Medicine | Admitting: Emergency Medicine

## 2014-07-18 ENCOUNTER — Encounter (HOSPITAL_COMMUNITY): Payer: Self-pay | Admitting: Emergency Medicine

## 2014-07-18 DIAGNOSIS — J449 Chronic obstructive pulmonary disease, unspecified: Secondary | ICD-10-CM | POA: Insufficient documentation

## 2014-07-18 DIAGNOSIS — H53149 Visual discomfort, unspecified: Secondary | ICD-10-CM | POA: Diagnosis not present

## 2014-07-18 DIAGNOSIS — Z720 Tobacco use: Secondary | ICD-10-CM | POA: Diagnosis not present

## 2014-07-18 DIAGNOSIS — I10 Essential (primary) hypertension: Secondary | ICD-10-CM | POA: Insufficient documentation

## 2014-07-18 DIAGNOSIS — Z8659 Personal history of other mental and behavioral disorders: Secondary | ICD-10-CM | POA: Insufficient documentation

## 2014-07-18 DIAGNOSIS — R51 Headache: Secondary | ICD-10-CM | POA: Insufficient documentation

## 2014-07-18 DIAGNOSIS — R21 Rash and other nonspecific skin eruption: Secondary | ICD-10-CM | POA: Diagnosis not present

## 2014-07-18 DIAGNOSIS — Z79899 Other long term (current) drug therapy: Secondary | ICD-10-CM | POA: Diagnosis not present

## 2014-07-18 DIAGNOSIS — R110 Nausea: Secondary | ICD-10-CM | POA: Insufficient documentation

## 2014-07-18 DIAGNOSIS — R519 Headache, unspecified: Secondary | ICD-10-CM

## 2014-07-18 LAB — CBC WITH DIFFERENTIAL/PLATELET
BASOS PCT: 1 % (ref 0–1)
Basophils Absolute: 0.1 10*3/uL (ref 0.0–0.1)
EOS PCT: 4 % (ref 0–5)
Eosinophils Absolute: 0.3 10*3/uL (ref 0.0–0.7)
HCT: 47.3 % (ref 39.0–52.0)
HEMOGLOBIN: 16.5 g/dL (ref 13.0–17.0)
Lymphocytes Relative: 34 % (ref 12–46)
Lymphs Abs: 2.4 10*3/uL (ref 0.7–4.0)
MCH: 31.5 pg (ref 26.0–34.0)
MCHC: 34.9 g/dL (ref 30.0–36.0)
MCV: 90.4 fL (ref 78.0–100.0)
MONO ABS: 0.5 10*3/uL (ref 0.1–1.0)
MONOS PCT: 7 % (ref 3–12)
NEUTROS ABS: 3.9 10*3/uL (ref 1.7–7.7)
Neutrophils Relative %: 54 % (ref 43–77)
Platelets: 246 10*3/uL (ref 150–400)
RBC: 5.23 MIL/uL (ref 4.22–5.81)
RDW: 13 % (ref 11.5–15.5)
WBC: 7 10*3/uL (ref 4.0–10.5)

## 2014-07-18 LAB — COMPREHENSIVE METABOLIC PANEL
ALT: 75 U/L — AB (ref 17–63)
ANION GAP: 11 (ref 5–15)
AST: 44 U/L — AB (ref 15–41)
Albumin: 4.4 g/dL (ref 3.5–5.0)
Alkaline Phosphatase: 57 U/L (ref 38–126)
BUN: 11 mg/dL (ref 6–20)
CALCIUM: 9.8 mg/dL (ref 8.9–10.3)
CO2: 24 mmol/L (ref 22–32)
CREATININE: 0.98 mg/dL (ref 0.61–1.24)
Chloride: 103 mmol/L (ref 101–111)
GFR calc Af Amer: >60 mL/min (ref >60)
GFR calc non Af Amer: >60 mL/min (ref >60)
Glucose, Bld: 99 mg/dL (ref 65–99)
Potassium: 4.2 mmol/L (ref 3.5–5.1)
SODIUM: 138 mmol/L (ref 135–145)
TOTAL PROTEIN: 7.5 g/dL (ref 6.5–8.1)
Total Bilirubin: 1 mg/dL (ref 0.3–1.2)

## 2014-07-18 MED ORDER — PROCHLORPERAZINE EDISYLATE 5 MG/ML IJ SOLN
10.0000 mg | Freq: Once | INTRAMUSCULAR | Status: AC
  Administered 2014-07-18: 10 mg via INTRAVENOUS
  Filled 2014-07-18: qty 2

## 2014-07-18 MED ORDER — DIPHENHYDRAMINE HCL 50 MG/ML IJ SOLN
25.0000 mg | Freq: Once | INTRAMUSCULAR | Status: AC
  Administered 2014-07-18: 25 mg via INTRAVENOUS
  Filled 2014-07-18: qty 1

## 2014-07-18 MED ORDER — KETOROLAC TROMETHAMINE 30 MG/ML IJ SOLN
30.0000 mg | Freq: Once | INTRAMUSCULAR | Status: AC
  Administered 2014-07-18: 30 mg via INTRAVENOUS
  Filled 2014-07-18: qty 1

## 2014-07-18 MED ORDER — SODIUM CHLORIDE 0.9 % IV BOLUS (SEPSIS)
1000.0000 mL | Freq: Once | INTRAVENOUS | Status: AC
Start: 2014-07-18 — End: 2014-07-19
  Administered 2014-07-18: 1000 mL via INTRAVENOUS

## 2014-07-18 NOTE — ED Notes (Signed)
Joe, PA-C, at the bedside.  

## 2014-07-18 NOTE — ED Provider Notes (Signed)
CSN: [id number]     Arrival date & time 07/18/14  1659 History   First MD Initiated Contact with Patient 07/18/14 2105     Chief Complaint  Patient presents with  . Headache  . Rash     (Consider location/radiation/quality/duration/timing/severity/associated sxs/prior Treatment) HPI [name] is a 39 year-old male with past medical history of anxiety, depression, hypertension who presents to the ER complaining of headache and rash. Patient states his headache began gradually 3 days ago, and has since persisted. Patient reports a constant, frontal headache, associated photophobia, phonophobia, nausea. Patient reports some mild visual disturbances. Patient states his headache is consistent with and identical to previous headaches he has experienced in the past. Patient states he used to go to urgent care for these headaches and received a IM injection which typically helps them. Patient denies numbness, weakness, blurred vision, dizziness.  Patient is also here for evaluation of a rash. Patient reports 4 days ago he noticed a rash on his left lower abdomen. Patient states that afternoon he was tubing on the [facility], later noticed to have an annular rash on his lower abdomen. Patient denies having any chest pain, shortness of breath, fever, vomiting, abdominal pain, dysuria.  Past Medical History  Diagnosis Date  . Anxiety   . Depression   . Hypertension   . COPD (chronic obstructive pulmonary disease)    Past Surgical History  Procedure Laterality Date  . Hip arthroscopy  [id number]    right hip  . Joint replacement  2009    Right THR-- [name]  . Femur bone graft  2011    Cadaver-- Dr Mickle Plumb at [facility] History  Problem Relation Age of Onset  . Heart disease Father   . Birth defects Other   . Heart disease Other   . Diabetes Other   . Hypertension Other    History  Substance Use Topics  . Smoking status: Current Every Day Smoker  . Smokeless tobacco: Never Used  .  Alcohol Use: Yes     Comment: occ    Review of Systems  Constitutional: Negative for fever.  HENT: Negative for trouble swallowing.   Eyes: Positive for photophobia and visual disturbance.  Respiratory: Negative for shortness of breath.   Cardiovascular: Negative for chest pain.  Gastrointestinal: Positive for nausea. Negative for vomiting and abdominal pain.  Genitourinary: Negative for dysuria.  Musculoskeletal: Negative for neck pain.  Skin: Negative for rash.  Neurological: Positive for headaches. Negative for dizziness, weakness and numbness.  Psychiatric/Behavioral: Negative.       Allergies  Bee venom; Fluoxetine hcl; Methocarbamol; and Sertraline hcl  Home Medications   Prior to Admission medications   Medication Sig Start Date End Date Taking? Authorizing Provider  albuterol (PROVENTIL HFA;VENTOLIN HFA) 108 (90 BASE) MCG/ACT inhaler Inhale 2 puffs into the lungs every 6 (six) hours as needed for wheezing or shortness of breath. 02/27/14  Yes Karie Schwalbe, MD  EPINEPHrine (EPIPEN) 0.3 mg/0.3 mL SOAJ injection Inject 0.3 mLs (0.3 mg total) into the muscle as needed. 10/16/12  Yes Linwood Dibbles, MD  gabapentin (NEURONTIN) 300 MG capsule Take 600-1,200 capsules by mouth 3 (three) times daily. Take 600 mg morning and noon;  Take 1200 mg at bedtime 10/16/13  Yes Historical Provider, MD  ibuprofen (ADVIL,MOTRIN) 800 MG tablet Take 800 mg by mouth 3 (three) times daily as needed for pain.    Yes Historical Provider, MD  chlorthalidone (HYGROTON) 25 MG tablet Take 1 tablet (25  mg total) by mouth daily. 03/27/13   Karie Schwalbe, MD   BP 120/64 mmHg  Pulse 54  Temp(Src) 98.1 F (36.7 C) (Oral)  Resp 14  Ht 5\' 9"  (1.753 m)  Wt 220 lb 4.8 oz (99.927 kg)  BMI 32.52 kg/m2  SpO2 98% Physical Exam  Constitutional: He is oriented to person, place, and time. He appears well-developed and well-nourished. No distress.  HENT:  Head: Normocephalic and atraumatic.  Mouth/Throat:  Oropharynx is clear and moist. No oropharyngeal exudate.  Eyes: Right eye exhibits no discharge. Left eye exhibits no discharge. No scleral icterus.  Neck: Normal range of motion.  Cardiovascular: Normal rate, regular rhythm and normal heart sounds.   No murmur heard. Pulmonary/Chest: Effort normal and breath sounds normal. No respiratory distress.  Abdominal: Soft. There is no tenderness.  6x10 cm annular, erythematous, macular rash with central clearing noted to LLQ of abd.    Musculoskeletal: Normal range of motion. He exhibits no edema or tenderness.  Neurological: He is alert and oriented to person, place, and time. He has normal strength. No cranial nerve deficit. Coordination normal. GCS eye subscore is 4. GCS verbal subscore is 5. GCS motor subscore is 6.  Patient fully alert, answering questions appropriately in full, clear sentences. Cranial nerves II through XII grossly intact. Motor strength 5 out of 5 in all major muscle groups of upper and lower extremities. Distal sensation intact.   Skin: Skin is warm and dry. No rash noted. He is not diaphoretic.  Psychiatric: He has a normal mood and affect.  Nursing note and vitals reviewed.   ED Course  Procedures (including critical care time) Labs Review Labs Reviewed  COMPREHENSIVE METABOLIC PANEL - Abnormal; Notable for the following:    AST 44 (*)    ALT 75 (*)    All other components within normal limits  CBC WITH DIFFERENTIAL/PLATELET    Imaging Review No results found.   EKG Interpretation None      MDM   Final diagnoses:  None    1. HA Pt HA treated and improved while in ED.  Presentation is like pts typical HA and non concerning for [facility], ICH, Meningitis, or temporal arteritis. Pt is afebrile with no focal neuro deficits, nuchal rigidity, or change in vision.   2. Rash Rash concerning for possible erythema migrans.  Plan to give Rx for abx.  Pt's labwork unremarkable for acute pathology.    Pt signed out  AMA-- states he was not comfortable in the bed.  Pt AOx4, afebrile, hemodynamically stable and in no acute distress.  Signed,  Ladona Mow, PA-C 12:00 AM    Ladona Mow, PA-C 07/20/14 0001  Purvis Sheffield, MD 07/20/14 Marlyne Beards

## 2014-07-18 NOTE — ED Notes (Signed)
Pt c/o rash to left side in flank area that is itching; pt sts frontal HA x 3 days

## 2014-07-18 NOTE — ED Notes (Signed)
Patient restless, reported to Joe, PA-C. He acknowledges, orders benadryl.

## 2014-07-19 DIAGNOSIS — R51 Headache: Secondary | ICD-10-CM | POA: Diagnosis not present

## 2014-07-19 NOTE — ED Notes (Signed)
Reported to Herma Mering, that patient is leaving AMA, being impatient for treatment. He acknowledges.

## 2014-07-19 NOTE — ED Notes (Signed)
Patient ready to leave AMA

## 2014-08-14 DIAGNOSIS — M1711 Unilateral primary osteoarthritis, right knee: Secondary | ICD-10-CM | POA: Diagnosis not present

## 2014-08-15 ENCOUNTER — Other Ambulatory Visit: Payer: Self-pay | Admitting: Orthopedic Surgery

## 2014-08-15 DIAGNOSIS — R609 Edema, unspecified: Secondary | ICD-10-CM

## 2014-08-15 DIAGNOSIS — M2560 Stiffness of unspecified joint, not elsewhere classified: Secondary | ICD-10-CM

## 2014-08-15 DIAGNOSIS — R52 Pain, unspecified: Secondary | ICD-10-CM

## 2014-08-25 ENCOUNTER — Ambulatory Visit (INDEPENDENT_AMBULATORY_CARE_PROVIDER_SITE_OTHER): Payer: Self-pay | Admitting: Family Medicine

## 2014-08-25 VITALS — BP 128/90 | HR 89 | Temp 99.5°F | Resp 18 | Ht 68.5 in | Wt 211.8 lb

## 2014-08-25 DIAGNOSIS — K047 Periapical abscess without sinus: Secondary | ICD-10-CM

## 2014-08-25 MED ORDER — HYDROCODONE-ACETAMINOPHEN 5-325 MG PO TABS: 1.0000 | ORAL_TABLET | Freq: Four times a day (QID) | ORAL | Status: DC | PRN

## 2014-08-25 MED ORDER — PENICILLIN V POTASSIUM 500 MG PO TABS: 500.0000 mg | ORAL_TABLET | Freq: Three times a day (TID) | ORAL | Status: DC

## 2014-08-25 NOTE — Patient Instructions (Signed)
Try calling either Onalee Hua or Nuala Alpha, both dentists in town in different offices   Dental Abscess A dental abscess is a collection of infected fluid (pus) from a bacterial infection in the inner part of the tooth (pulp). It usually occurs at the end of the tooth's root.  CAUSES   Severe tooth decay.  Trauma to the tooth that allows bacteria to enter into the pulp, such as a broken or chipped tooth. SYMPTOMS   Severe pain in and around the infected tooth.  Swelling and redness around the abscessed tooth or in the mouth or face.  Tenderness.  Pus drainage.  Bad breath.  Bitter taste in the mouth.  Difficulty swallowing.  Difficulty opening the mouth.  Nausea.  Vomiting.  Chills.  Swollen neck glands. DIAGNOSIS   A medical and dental history will be taken.  An examination will be performed by tapping on the abscessed tooth.  X-rays may be taken of the tooth to identify the abscess. TREATMENT The goal of treatment is to eliminate the infection. You may be prescribed antibiotic medicine to stop the infection from spreading. A root canal may be performed to save the tooth. If the tooth cannot be saved, it may be pulled (extracted) and the abscess may be drained.  HOME CARE INSTRUCTIONS  Only take over-the-counter or prescription medicines for pain, fever, or discomfort as directed by your caregiver.  Rinse your mouth (gargle) often with salt water ( tsp salt in 8 oz [250 ml] of warm water) to relieve pain or swelling.  Do not drive after taking pain medicine (narcotics).  Do not apply heat to the outside of your face.  Return to your dentist for further treatment as directed. SEEK MEDICAL CARE IF:  Your pain is not helped by medicine.  Your pain is getting worse instead of better. SEEK IMMEDIATE MEDICAL CARE IF:  You have a fever or persistent symptoms for more than 2-3 days.  You have a fever and your symptoms suddenly get worse.  You have chills or a  very bad headache.  You have problems breathing or swallowing.  You have trouble opening your mouth.  You have swelling in the neck or around the eye. Document Released: 01/19/2005 Document Revised: 10/14/2011 Document Reviewed: 04/29/2010 [facility] Patient Information 2015 [place], [place]. This information is not intended to replace advice given to you by your health care provider. Make sure you discuss any questions you have with your health care provider.

## 2014-08-25 NOTE — Progress Notes (Signed)
   Subjective:    Patient ID: [name], male    DOB: [birth year], 39 y.o.   MRN: [id number] This chart was scribed for Henry Sidle, MD by Jolene Provost, Medical Scribe. This patient was seen in Room 5 and the patient's care was started a 2:09 PM.  Chief Complaint  Patient presents with  . Dental Pain    fill in came out, right side swollen     HPI HPI Comments: [name] is a 39 y.o. male who presents to [facility] complaining of right-sided facial swelling with associated right-sided tooth pain for the last 24 hours. Pt states his filling fell out three days ago and since that point he has had pain. He woke up this morning with a severely swollen face and significant pain.     Review of Systems  Constitutional: Negative for fever and chills.  HENT: Positive for facial swelling and mouth sores.   Neurological: Positive for speech difficulty and headaches.       Objective:   Physical Exam  Constitutional: He is oriented to person, place, and time. He appears well-developed and well-nourished. No distress.  HENT:  Head: Normocephalic and atraumatic.  Swollen right cheeck, and swelling around tooth number 27.   Eyes: Pupils are equal, round, and reactive to light.  Neck: Neck supple.  Cardiovascular: Normal rate.   Pulmonary/Chest: Effort normal. No respiratory distress.  Musculoskeletal: Normal range of motion.  Neurological: He is alert and oriented to person, place, and time. Coordination normal.  Skin: Skin is warm and dry. He is not diaphoretic.  Psychiatric: He has a normal mood and affect. His behavior is normal.  Nursing note and vitals reviewed.      Assessment & Plan:   This chart was scribed in my presence and reviewed by me personally.    ICD-9-CM ICD-10-CM   1. Dental abscess 522.5 K04.7 penicillin v potassium (VEETID) 500 MG tablet     HYDROcodone-acetaminophen (NORCO) 5-325 MG per tablet     Signed, Henry Sidle, MD

## 2014-08-27 ENCOUNTER — Ambulatory Visit
Admission: RE | Admit: 2014-08-27 | Discharge: 2014-08-27 | Disposition: A | Discharge status: Home / Self Care | Payer: Medicare Other | Source: Ambulatory Visit | Attending: Orthopedic Surgery | Admitting: Orthopedic Surgery

## 2014-08-27 DIAGNOSIS — M2560 Stiffness of unspecified joint, not elsewhere classified: Secondary | ICD-10-CM

## 2014-08-27 DIAGNOSIS — M25561 Pain in right knee: Secondary | ICD-10-CM | POA: Diagnosis not present

## 2014-08-27 DIAGNOSIS — R609 Edema, unspecified: Secondary | ICD-10-CM

## 2014-08-27 DIAGNOSIS — R52 Pain, unspecified: Secondary | ICD-10-CM

## 2014-09-24 DIAGNOSIS — G4730 Sleep apnea, unspecified: Secondary | ICD-10-CM | POA: Diagnosis not present

## 2014-09-24 DIAGNOSIS — G894 Chronic pain syndrome: Secondary | ICD-10-CM | POA: Diagnosis not present

## 2014-09-24 DIAGNOSIS — I10 Essential (primary) hypertension: Secondary | ICD-10-CM | POA: Diagnosis not present

## 2014-09-24 DIAGNOSIS — Z6832 Body mass index (BMI) 32.0-32.9, adult: Secondary | ICD-10-CM | POA: Diagnosis not present

## 2014-09-24 DIAGNOSIS — F17200 Nicotine dependence, unspecified, uncomplicated: Secondary | ICD-10-CM | POA: Diagnosis not present

## 2014-09-24 DIAGNOSIS — Z1389 Encounter for screening for other disorder: Secondary | ICD-10-CM | POA: Diagnosis not present

## 2014-09-24 DIAGNOSIS — E669 Obesity, unspecified: Secondary | ICD-10-CM | POA: Diagnosis not present

## 2014-09-24 DIAGNOSIS — Z13220 Encounter for screening for lipoid disorders: Secondary | ICD-10-CM | POA: Diagnosis not present

## 2014-09-24 DIAGNOSIS — Z131 Encounter for screening for diabetes mellitus: Secondary | ICD-10-CM | POA: Diagnosis not present

## 2014-10-18 DIAGNOSIS — G894 Chronic pain syndrome: Secondary | ICD-10-CM | POA: Diagnosis not present

## 2014-10-18 DIAGNOSIS — M47817 Spondylosis without myelopathy or radiculopathy, lumbosacral region: Secondary | ICD-10-CM | POA: Diagnosis not present

## 2014-10-26 DIAGNOSIS — I10 Essential (primary) hypertension: Secondary | ICD-10-CM | POA: Diagnosis not present

## 2014-10-26 DIAGNOSIS — F17210 Nicotine dependence, cigarettes, uncomplicated: Secondary | ICD-10-CM | POA: Diagnosis not present

## 2014-10-26 DIAGNOSIS — J209 Acute bronchitis, unspecified: Secondary | ICD-10-CM | POA: Diagnosis not present

## 2014-10-26 DIAGNOSIS — E784 Other hyperlipidemia: Secondary | ICD-10-CM | POA: Diagnosis not present

## 2014-12-20 DIAGNOSIS — M47817 Spondylosis without myelopathy or radiculopathy, lumbosacral region: Secondary | ICD-10-CM | POA: Diagnosis not present

## 2015-02-08 DIAGNOSIS — G894 Chronic pain syndrome: Secondary | ICD-10-CM | POA: Diagnosis not present

## 2015-05-10 DIAGNOSIS — M25571 Pain in right ankle and joints of right foot: Secondary | ICD-10-CM | POA: Diagnosis not present

## 2015-05-10 DIAGNOSIS — M1711 Unilateral primary osteoarthritis, right knee: Secondary | ICD-10-CM | POA: Diagnosis not present

## 2015-05-27 ENCOUNTER — Emergency Department (HOSPITAL_COMMUNITY)
Admission: EM | Admit: 2015-05-27 | Discharge: 2015-05-27 | Disposition: A | Discharge status: Home / Self Care | Payer: Medicare Other | Attending: Emergency Medicine | Admitting: Emergency Medicine

## 2015-05-27 ENCOUNTER — Encounter (HOSPITAL_COMMUNITY): Payer: Self-pay | Admitting: Emergency Medicine

## 2015-05-27 ENCOUNTER — Emergency Department (HOSPITAL_COMMUNITY): Payer: Medicare Other

## 2015-05-27 DIAGNOSIS — F17200 Nicotine dependence, unspecified, uncomplicated: Secondary | ICD-10-CM | POA: Insufficient documentation

## 2015-05-27 DIAGNOSIS — Y9389 Activity, other specified: Secondary | ICD-10-CM | POA: Insufficient documentation

## 2015-05-27 DIAGNOSIS — F329 Major depressive disorder, single episode, unspecified: Secondary | ICD-10-CM | POA: Insufficient documentation

## 2015-05-27 DIAGNOSIS — Y998 Other external cause status: Secondary | ICD-10-CM | POA: Diagnosis not present

## 2015-05-27 DIAGNOSIS — X58XXXA Exposure to other specified factors, initial encounter: Secondary | ICD-10-CM | POA: Insufficient documentation

## 2015-05-27 DIAGNOSIS — Y9289 Other specified places as the place of occurrence of the external cause: Secondary | ICD-10-CM | POA: Diagnosis not present

## 2015-05-27 DIAGNOSIS — S82891A Other fracture of right lower leg, initial encounter for closed fracture: Secondary | ICD-10-CM

## 2015-05-27 DIAGNOSIS — Z79899 Other long term (current) drug therapy: Secondary | ICD-10-CM | POA: Diagnosis not present

## 2015-05-27 DIAGNOSIS — Z792 Long term (current) use of antibiotics: Secondary | ICD-10-CM | POA: Diagnosis not present

## 2015-05-27 DIAGNOSIS — J449 Chronic obstructive pulmonary disease, unspecified: Secondary | ICD-10-CM | POA: Diagnosis not present

## 2015-05-27 DIAGNOSIS — S8251XA Displaced fracture of medial malleolus of right tibia, initial encounter for closed fracture: Secondary | ICD-10-CM | POA: Diagnosis not present

## 2015-05-27 DIAGNOSIS — I10 Essential (primary) hypertension: Secondary | ICD-10-CM | POA: Diagnosis not present

## 2015-05-27 DIAGNOSIS — F419 Anxiety disorder, unspecified: Secondary | ICD-10-CM | POA: Insufficient documentation

## 2015-05-27 DIAGNOSIS — S99911A Unspecified injury of right ankle, initial encounter: Secondary | ICD-10-CM | POA: Diagnosis present

## 2015-05-27 MED ORDER — OXYCODONE-ACETAMINOPHEN 5-325 MG PO TABS: 1.0000 | ORAL_TABLET | ORAL | Status: DC | PRN

## 2015-05-27 MED ORDER — HYDROMORPHONE HCL 1 MG/ML IJ SOLN
1.0000 mg | Freq: Once | INTRAMUSCULAR | Status: AC
  Administered 2015-05-27: 1 mg via INTRAMUSCULAR
  Filled 2015-05-27: qty 1

## 2015-05-27 NOTE — ED Provider Notes (Signed)
CSN: [id number]     Arrival date & time 05/27/15  1606 History  By signing my name below, I, [name] Center[facility], attest that this documentation has been prepared under the direction and in the presence of Henry FowlerKayla Zohar Laing, PA-C. Electronically Signed: [name] PatientMarrissa Wagner, ED Scribe. 05/27/2015. 6:02 PM.  Chief Complaint  Wagner presents with  . Ankle Pain   The history is provided by the Wagner. No language interpreter was used.  HPI Comments: Henry ChurnBrian D Wagner is a 40 y.o. male who presents to the Emergency Department complaining of constant, moderate right ankle onset earlier today after an injury and fall. Wagner states that he was walking on a sidewalk when he mistepped on the edge, twisting his ankle and causing him to fall to the ground on his right side, followed immediately by pain in his ankle. He notes reduced sensation in his right foot. Pain worse with movement and palpation. Denies any other symptoms currently.   Past Medical History  Diagnosis Date  . Anxiety   . Depression   . Hypertension   . COPD (chronic obstructive pulmonary disease) [name](HCC)    Past Surgical History  Procedure Laterality Date  . Hip arthroscopy  [id number]    right hip  . Joint replacement  2009    Right THR-- [name]  . Femur bone graft  2011    Cadaver-- [name] at [name]   Family History  Problem Relation Age of Onset  . Heart disease Father   . Birth defects Other   . Heart disease Other   . Diabetes Other   . Hypertension Other    Social History  Substance Use Topics  . Smoking status: Current Every Day Smoker  . Smokeless tobacco: Never Used  . Alcohol Use: Yes     Comment: occ    Review of Systems  Musculoskeletal: Positive for arthralgias (right ankle).  All other systems reviewed and are negative.   Allergies  Bee venom; Fluoxetine hcl; Methocarbamol; and Sertraline hcl  Home Medications   Prior to Admission medications   Medication Sig Start Date End Date Taking? Authorizing  Provider  albuterol (PROVENTIL HFA;VENTOLIN HFA) 108 (90 BASE) MCG/ACT inhaler Inhale 2 puffs into the lungs every 6 (six) hours as needed for wheezing or shortness of breath. 02/27/14   [name], MD  CHLOROTHIAZIDE PO Take 20 mg by mouth.    Historical Provider, MD  chlorthalidone (HYGROTON) 25 MG tablet Take 1 tablet (25 mg total) by mouth daily. 03/27/13   [name], MD  EPINEPHrine (EPIPEN) 0.3 mg/0.3 mL SOAJ injection Inject 0.3 mLs (0.3 mg total) into the muscle as needed. 10/16/12   [name], MD  gabapentin (NEURONTIN) 300 MG capsule Take 600-1,200 capsules by mouth 3 (three) times daily. Take 600 mg morning and noon;  Take 1200 mg at bedtime 10/16/13   Historical Provider, MD  HYDROcodone-acetaminophen (NORCO) 5-325 MG per tablet Take 1 tablet by mouth every 6 (six) hours as needed for moderate pain. 08/25/14   [name], MD  ibuprofen (ADVIL,MOTRIN) 800 MG tablet Take 800 mg by mouth 3 (three) times daily as needed for pain.     Historical Provider, MD  penicillin v potassium (VEETID) 500 MG tablet Take 1 tablet (500 mg total) by mouth 3 (three) times daily. 08/25/14   [name], MD   BP 126/84 mmHg  Pulse 103  Temp(Src) 98.5 F (36.9 C) (Oral)  Resp 18  Ht 5\' 9"  (1.753 m)  Wt 92.987 kg  BMI 30.26 kg/m2  SpO2 99% Physical Exam  Constitutional: He is oriented to person, place, and time. He appears well-developed and well-nourished.  Non-toxic appearance. He does not have a sickly appearance. He does not appear ill.  HENT:  Head: Normocephalic and atraumatic.  Mouth/Throat: Oropharynx is clear and moist.  Eyes: Conjunctivae are normal. Pupils are equal, round, and reactive to light.  Neck: Normal range of motion. Neck supple.  Cardiovascular:  Pulses:      Dorsalis pedis pulses are 2+ on the right side.  Capillary refill less than 3 seconds in RLE.   Pulmonary/Chest: Effort normal. No accessory muscle usage or stridor. He has no rhonchi.  Abdominal: He  exhibits no distension.  Musculoskeletal:       Right knee: Normal.       Left knee: Normal.       Right ankle: He exhibits decreased range of motion (secondary to pain) and swelling. He exhibits no deformity and no laceration. Tenderness. Medial malleolus tenderness found.       Left ankle: Normal.  Compartment soft and compressible.   Lymphadenopathy:    He has no cervical adenopathy.  Neurological: He is alert and oriented to person, place, and time.  Sensation intact to light touch.  Skin: Skin is warm and dry.  Psychiatric: He has a normal mood and affect. His behavior is normal.    ED Course  Procedures   DIAGNOSTIC STUDIES: Oxygen Saturation is 99% on RA, normal by my interpretation.    COORDINATION OF CARE: 5:07 PM Ordered Dilaudid. Will consult with orthopedist. Discussed treatment plan with pt at bedside and pt agreed to plan.  5:57 PM Consulted with Henry. Eulah Wagner. Will order pt splint and discharge home with follow-up with Henry. Eulah Wagner. Returned to inform pt of plan.  Imaging Review Dg Ankle Complete Right  05/27/2015  CLINICAL DATA:  Right ankle pain/ swelling, unable to bear weight EXAM: RIGHT ANKLE - COMPLETE 3+ VIEW COMPARISON:  None. FINDINGS: Cortical avulsion fracture along the inferior aspect of the medial malleolus. Mild cortical irregularity along the inferior aspect of the lateral malleolus, well corticated and favored to reflect sequela of prior trauma rather than acute abnormality. The ankle mortise is intact. The base of the fifth metatarsal is unremarkable. Mild medial soft tissue swelling. IMPRESSION: Cortical avulsion fracture along the medial malleolus. Overlying soft tissue swelling. Electronically Signed   By: [name] M.D.   On: 05/27/2015 16:59   I have personally reviewed and evaluated these images as part of my medical decision-making.   MDM   Final diagnoses:  Ankle fracture, right, closed, initial encounter    Wagner X-Ray positive for  obvious cortical avlusion fracture along the medial malleolus.  Neurovascularly intact. Spoke with Henry. Eulah Wagner, no need for emergent CT at this time.  Wagner given Dilaudid in ED.  Placed in CAM walker and given crutches.  Pt advised to follow up with orthopedics Wednesday morning. Plan to discharge with Percocet. Wagner will be discharged home & is agreeable with above plan. Returns precautions discussed. Pt appears safe for discharge.  I personally performed the services described in this documentation, which was scribed in my presence. The recorded information has been reviewed and is accurate.    Henry Fowler, PA-C 05/27/15 1810  Lavera Guise, MD 05/28/15 [phone]

## 2015-05-27 NOTE — ED Notes (Signed)
Pt states he slipped and fell twisting his R ankle and landing on his R side. Pt c/o R ankle and R knee pain. Swelling noted to R ankle. + pulse and neuro sensation noted.

## 2015-05-27 NOTE — ED Notes (Signed)
Pt in xray

## 2015-05-27 NOTE — Progress Notes (Signed)
Orthopedic Tech Progress Note Patient Details:  [name] [birth year] [id number]  Ortho Devices Type of Ortho Device: CAM walker, Crutches Ortho Device/Splint Location: RLE Ortho Device/Splint Interventions: Ordered, Application   Jennye MoccasinHughes, [name] 05/27/2015, 6:18 PM

## 2015-05-27 NOTE — ED Notes (Signed)
Applied ice to pt's ankle

## 2015-05-27 NOTE — Discharge Instructions (Signed)
You xrays today show a cortical avulsion fracture of your medial malleolus.  We have placed you in a CAM walker and crutches.  Please follow up with Dr. Eulah Pont Wednesday morning.  Call his office to schedule an appointment.  Take Percocet every 4 hours for pain.  Ankle Pain   Ankle pain is a common symptom. The bones, cartilage, tendons, and muscles of the ankle joint perform a lot of work each day. The ankle joint holds your body weight and allows you to move around. Ankle pain can occur on either side or back of 1 or both ankles. Ankle pain may be sharp and burning or dull and aching. There may be tenderness, stiffness, redness, or warmth around the ankle. The pain occurs more often when a person walks or puts pressure on the ankle.  CAUSES  There are many reasons ankle pain can develop. It is important to work with your caregiver to identify the cause since many conditions can impact the bones, cartilage, muscles, and tendons. Causes for ankle pain include:  Injury, including a break (fracture), sprain, or strain often due to a fall, sports, or a high-impact activity.  Swelling (inflammation) of a tendon (tendonitis).  Achilles tendon rupture.  Ankle instability after repeated sprains and strains.  Poor foot alignment.  Pressure on a nerve (tarsal tunnel syndrome).  Arthritis in the ankle or the lining of the ankle.  Crystal formation in the ankle (gout or pseudogout). DIAGNOSIS  A diagnosis is based on your medical history, your symptoms, results of your physical exam, and results of diagnostic tests. Diagnostic tests may include X-ray exams or a computerized magnetic scan (magnetic resonance imaging, MRI).  TREATMENT  Treatment will depend on the cause of your ankle pain and may include:  Keeping pressure off the ankle and limiting activities.  Using crutches or other walking support (a cane or brace).  Using rest, ice, compression, and elevation.  Participating in physical therapy or home  exercises.  Wearing shoe inserts or special shoes.  Losing weight.  Taking medications to reduce pain or swelling or receiving an injection.  Undergoing surgery. HOME CARE INSTRUCTIONS  Only take over-the-counter or prescription medicines for pain, discomfort, or fever as directed by your caregiver.  Put ice on the injured area.  Put ice in a plastic bag.  Place a towel between your skin and the bag.  Leave the ice on for 15-20 minutes at a time, 03-04 times a day. Keep your leg raised (elevated) when possible to lessen swelling.  Avoid activities that cause ankle pain.  Follow specific exercises as directed by your caregiver.  Record how often you have ankle pain, the location of the pain, and what it feels like. This information may be helpful to you and your caregiver.  Ask your caregiver about returning to work or sports and whether you should drive.  Follow up with your caregiver for further examination, therapy, or testing as directed. SEEK MEDICAL CARE IF:  Pain or swelling continues or worsens beyond 1 week.  You have an oral temperature above 102 F (38.9 C).  You are feeling unwell or have chills.  You are having an increasingly difficult time with walking.  You have loss of sensation or other new symptoms.  You have questions or concerns. MAKE SURE YOU:  Understand these instructions.  Will watch your condition.  Will get help right away if you are not doing well or get worse. This information is not intended to replace advice given  to you by your health care provider. Make sure you discuss any questions you have with your health care provider.  Document Released: 07/09/2009 Document Revised: 04/13/2011 Document Reviewed: 08/21/2014  Elsevier Interactive Patient Education [employer]2016 [employer] Inc[employer]

## 2015-05-29 ENCOUNTER — Telehealth: Payer: Self-pay

## 2015-05-29 DIAGNOSIS — S82844A Nondisplaced bimalleolar fracture of right lower leg, initial encounter for closed fracture: Secondary | ICD-10-CM | POA: Diagnosis not present

## 2015-05-29 NOTE — Telephone Encounter (Signed)
Left message on voice mail, per DPR, to follow-up with patient and to make sure he has set up his appointment with the orthopedic specialist as advised by the ER.

## 2015-06-18 DIAGNOSIS — L905 Scar conditions and fibrosis of skin: Secondary | ICD-10-CM | POA: Diagnosis not present

## 2015-06-18 DIAGNOSIS — M461 Sacroiliitis, not elsewhere classified: Secondary | ICD-10-CM | POA: Diagnosis not present

## 2015-06-18 DIAGNOSIS — M25561 Pain in right knee: Secondary | ICD-10-CM | POA: Diagnosis not present

## 2015-06-18 DIAGNOSIS — M47817 Spondylosis without myelopathy or radiculopathy, lumbosacral region: Secondary | ICD-10-CM | POA: Diagnosis not present

## 2015-06-18 DIAGNOSIS — G8929 Other chronic pain: Secondary | ICD-10-CM | POA: Diagnosis not present

## 2015-06-18 DIAGNOSIS — R52 Pain, unspecified: Secondary | ICD-10-CM | POA: Diagnosis not present

## 2015-06-18 DIAGNOSIS — G894 Chronic pain syndrome: Secondary | ICD-10-CM | POA: Diagnosis not present

## 2015-06-18 DIAGNOSIS — M5137 Other intervertebral disc degeneration, lumbosacral region: Secondary | ICD-10-CM | POA: Diagnosis not present

## 2015-06-20 DIAGNOSIS — M533 Sacrococcygeal disorders, not elsewhere classified: Secondary | ICD-10-CM | POA: Diagnosis not present

## 2015-06-26 DIAGNOSIS — S82844D Nondisplaced bimalleolar fracture of right lower leg, subsequent encounter for closed fracture with routine healing: Secondary | ICD-10-CM | POA: Diagnosis not present

## 2015-07-29 ENCOUNTER — Encounter (HOSPITAL_COMMUNITY): Payer: Self-pay | Admitting: Emergency Medicine

## 2015-07-29 DIAGNOSIS — I10 Essential (primary) hypertension: Secondary | ICD-10-CM | POA: Insufficient documentation

## 2015-07-29 DIAGNOSIS — J449 Chronic obstructive pulmonary disease, unspecified: Secondary | ICD-10-CM | POA: Diagnosis not present

## 2015-07-29 DIAGNOSIS — R51 Headache: Secondary | ICD-10-CM | POA: Diagnosis not present

## 2015-07-29 DIAGNOSIS — Z96641 Presence of right artificial hip joint: Secondary | ICD-10-CM | POA: Insufficient documentation

## 2015-07-29 DIAGNOSIS — F17200 Nicotine dependence, unspecified, uncomplicated: Secondary | ICD-10-CM | POA: Insufficient documentation

## 2015-07-29 DIAGNOSIS — Z79899 Other long term (current) drug therapy: Secondary | ICD-10-CM | POA: Insufficient documentation

## 2015-07-29 NOTE — ED Notes (Signed)
Pt. reports migraine  headache for 3 days with emesis and photophobia , denies fever or chills.

## 2015-07-30 ENCOUNTER — Emergency Department (HOSPITAL_COMMUNITY)
Admission: EM | Admit: 2015-07-30 | Discharge: 2015-07-30 | Disposition: A | Discharge status: Home / Self Care | Payer: Medicare Other | Attending: Emergency Medicine | Admitting: Emergency Medicine

## 2015-07-30 DIAGNOSIS — R51 Headache: Secondary | ICD-10-CM

## 2015-07-30 DIAGNOSIS — R519 Headache, unspecified: Secondary | ICD-10-CM

## 2015-07-30 MED ORDER — METOCLOPRAMIDE HCL 5 MG/ML IJ SOLN
10.0000 mg | Freq: Once | INTRAMUSCULAR | Status: AC
  Administered 2015-07-30: 10 mg via INTRAVENOUS
  Filled 2015-07-30: qty 2

## 2015-07-30 MED ORDER — DIPHENHYDRAMINE HCL 50 MG/ML IJ SOLN
25.0000 mg | Freq: Once | INTRAMUSCULAR | Status: AC
  Administered 2015-07-30: 25 mg via INTRAVENOUS
  Filled 2015-07-30: qty 1

## 2015-07-30 MED ORDER — DEXAMETHASONE SODIUM PHOSPHATE 10 MG/ML IJ SOLN
10.0000 mg | Freq: Once | INTRAMUSCULAR | Status: AC
  Administered 2015-07-30: 10 mg via INTRAVENOUS
  Filled 2015-07-30: qty 1

## 2015-07-30 NOTE — Discharge Instructions (Signed)

## 2015-07-30 NOTE — ED Provider Notes (Signed)
CSN: [id number]     Arrival date & time 07/29/15  2330 History   First MD Initiated Contact with Patient 07/30/15 [phone]     Chief Complaint  Patient presents with  . Migraine     (Consider location/radiation/quality/duration/timing/severity/associated sxs/prior Treatment) HPI Comments: Patient with a history of HTN, COPD, headaches presents with headache similar to previous pain. Symptoms started 3 days ago and are associated with photophobia, nausea and vomiting. No fever, cough, hematemesis. He has tried OTC medications without relief.   The history is provided by the patient. No language interpreter was used.    Past Medical History  Diagnosis Date  . Anxiety   . Depression   . Hypertension   . COPD (chronic obstructive pulmonary disease) [facility](HCC)    Past Surgical History  Procedure Laterality Date  . Hip arthroscopy  [id number]    right hip  . Joint replacement  2009    Right THR-- [name]  . Femur bone graft  2011    [facility]adaver[name]   Family History  Problem Relation Age of Onset  . Heart disease Father   . Birth defects Other   . Heart disease Other   . Diabetes Other   . Hypertension Other    Social History  Substance Use Topics  . Smoking status: Current Every Day Smoker  . Smokeless tobacco: Never Used  . Alcohol Use: Yes     Comment: occ    Review of Systems  Constitutional: Negative for fever and chills.  HENT: Negative for congestion.   Eyes: Positive for photophobia.  Respiratory: Negative.  Negative for cough and shortness of breath.   Cardiovascular: Negative.   Gastrointestinal: Positive for nausea and vomiting. Negative for abdominal pain.  Musculoskeletal: Negative.  Negative for neck stiffness.  Skin: Negative.  Negative for rash.  Neurological: Positive for headaches.      Allergies  Bee venom; Fluoxetine hcl; Methocarbamol; and Sertraline hcl  Home Medications   Prior to Admission medications   Medication Sig Start Date End  Date Taking? Authorizing Provider  albuterol (PROVENTIL HFA;VENTOLIN HFA) 108 (90 BASE) MCG/ACT inhaler Inhale 2 puffs into the lungs every 6 (six) hours as needed for wheezing or shortness of breath. 02/27/14   [name], MD  CHLOROTHIAZIDE PO Take 20 mg by mouth.    Historical Provider, MD  chlorthalidone (HYGROTON) 25 MG tablet Take 1 tablet (25 mg total) by mouth daily. 03/27/13   [name], MD  EPINEPHrine (EPIPEN) 0.3 mg/0.3 mL SOAJ injection Inject 0.3 mLs (0.3 mg total) into the muscle as needed. 10/16/12   [name], MD  gabapentin (NEURONTIN) 300 MG capsule Take 600-1,200 capsules by mouth 3 (three) times daily. Take 600 mg morning and noon;  Take 1200 mg at bedtime 10/16/13   Historical Provider, MD  HYDROcodone-acetaminophen (NORCO) 5-325 MG per tablet Take 1 tablet by mouth every 6 (six) hours as needed for moderate pain. 08/25/14   [name], MD  ibuprofen (ADVIL,MOTRIN) 800 MG tablet Take 800 mg by mouth 3 (three) times daily as needed for pain.     Historical Provider, MD  oxyCODONE-acetaminophen (PERCOCET/ROXICET) 5-325 MG tablet Take 1 tablet by mouth every 4 (four) hours as needed for severe pain. 05/27/15   Cheri FowlerKayla Rose, PA-C  penicillin v potassium (VEETID) 500 MG tablet Take 1 tablet (500 mg total) by mouth 3 (three) times daily. 08/25/14   [name], MD   BP 134/89 mmHg  Pulse 56  Temp(Src) 98.7  F (37.1 C) (Oral)  Resp 18  SpO2 99% Physical Exam  Constitutional: He is oriented to person, place, and time. He appears well-developed and well-nourished.  HENT:  Head: Normocephalic.  Eyes: EOM are normal. Pupils are equal, round, and reactive to light.  Neck: Normal range of motion. Neck supple.  Cardiovascular: Normal rate.   Pulmonary/Chest: Effort normal. No respiratory distress.  Abdominal: Soft. Bowel sounds are normal. There is no tenderness. There is no rebound and no guarding.  Musculoskeletal: Normal range of motion. He exhibits no edema.   Neurological: He is alert and oriented to person, place, and time.  Cn's 3-12 grossly intact. No deficits of coordination. Speech clear, focused and goal directed. Ambulatory without ataxia.   Skin: Skin is warm and dry. No rash noted.  Psychiatric: He has a normal mood and affect.    ED Course  Procedures (including critical care time) Labs Review Labs Reviewed - No data to display  Imaging Review No results found. I have personally reviewed and evaluated these images and lab results as part of my medical decision-making.   EKG Interpretation None      MDM   Final diagnoses:  None    1. Headache  Patient with recurrent headache in typical pattern x 3 days. Symptoms resolved with migraine cocktail and patient is resting comfortably. VSS. He is felt appropriate for discharge home.     Elpidio AnisShari Letesha Klecker, PA-C 07/30/15 [phone]  Azalia BilisKevin Campos, MD 07/30/15 [phone]

## 2015-07-30 NOTE — ED Notes (Signed)
Pt departed in NAD.  

## 2015-09-28 ENCOUNTER — Emergency Department
Admission: EM | Admit: 2015-09-28 | Discharge: 2015-09-29 | Disposition: A | Discharge status: Home / Self Care | Payer: Medicare Other | Attending: Emergency Medicine | Admitting: Emergency Medicine

## 2015-09-28 ENCOUNTER — Encounter: Payer: Self-pay | Admitting: Emergency Medicine

## 2015-09-28 DIAGNOSIS — I10 Essential (primary) hypertension: Secondary | ICD-10-CM | POA: Diagnosis not present

## 2015-09-28 DIAGNOSIS — F1910 Other psychoactive substance abuse, uncomplicated: Secondary | ICD-10-CM | POA: Diagnosis not present

## 2015-09-28 DIAGNOSIS — Z79899 Other long term (current) drug therapy: Secondary | ICD-10-CM | POA: Diagnosis not present

## 2015-09-28 DIAGNOSIS — F1129 Opioid dependence with unspecified opioid-induced disorder: Secondary | ICD-10-CM | POA: Diagnosis not present

## 2015-09-28 DIAGNOSIS — J449 Chronic obstructive pulmonary disease, unspecified: Secondary | ICD-10-CM | POA: Diagnosis not present

## 2015-09-28 DIAGNOSIS — R002 Palpitations: Secondary | ICD-10-CM | POA: Diagnosis present

## 2015-09-28 DIAGNOSIS — F17200 Nicotine dependence, unspecified, uncomplicated: Secondary | ICD-10-CM | POA: Insufficient documentation

## 2015-09-28 DIAGNOSIS — Z792 Long term (current) use of antibiotics: Secondary | ICD-10-CM | POA: Insufficient documentation

## 2015-09-28 LAB — URINE DRUG SCREEN, QUALITATIVE (ARMC ONLY)
Amphetamines, Ur Screen: POSITIVE — AB
BARBITURATES, UR SCREEN: NOT DETECTED
Benzodiazepine, Ur Scrn: NOT DETECTED
COCAINE METABOLITE, UR ~~LOC~~: POSITIVE — AB
Cannabinoid 50 Ng, Ur ~~LOC~~: NOT DETECTED
MDMA (ECSTASY) UR SCREEN: NOT DETECTED
METHADONE SCREEN, URINE: NOT DETECTED
OPIATE, UR SCREEN: POSITIVE — AB
Phencyclidine (PCP) Ur S: NOT DETECTED
TRICYCLIC, UR SCREEN: NOT DETECTED

## 2015-09-28 LAB — BASIC METABOLIC PANEL
ANION GAP: 5 (ref 5–15)
BUN: 6 mg/dL (ref 6–20)
CALCIUM: 8.8 mg/dL — AB (ref 8.9–10.3)
CO2: 29 mmol/L (ref 22–32)
CREATININE: 0.87 mg/dL (ref 0.61–1.24)
Chloride: 107 mmol/L (ref 101–111)
GFR calc Af Amer: >60 mL/min (ref >60)
GLUCOSE: 116 mg/dL — AB (ref 65–99)
Potassium: 3.9 mmol/L (ref 3.5–5.1)
Sodium: 141 mmol/L (ref 135–145)

## 2015-09-28 LAB — ETHANOL

## 2015-09-28 MED ORDER — CLONIDINE HCL 0.1 MG PO TABS
0.1000 mg | ORAL_TABLET | Freq: Once | ORAL | Status: AC
  Administered 2015-09-28: 0.1 mg via ORAL
  Filled 2015-09-28: qty 1

## 2015-09-28 MED ORDER — ONDANSETRON 8 MG PO TBDP
8.0000 mg | ORAL_TABLET | Freq: Once | ORAL | Status: AC
  Administered 2015-09-28: 8 mg via ORAL
  Filled 2015-09-28: qty 1

## 2015-09-28 NOTE — [facility] Counselor (Signed)
UDS, BAC and BMP results faxed to [facility].

## 2015-09-28 NOTE — ED Notes (Signed)
MD at bedside. 

## 2015-09-28 NOTE — ED Notes (Signed)
Pt seen sitting in chair beside [address] bed/ pt in no signs of visible distress at this time. Pt not in need of anything from staff at this time

## 2015-09-28 NOTE — ED Triage Notes (Addendum)
Pt here for medical clearance for [facility].  Pt reports already talking to [facility] and has a bed reservation.  

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
## 2015-09-29 DIAGNOSIS — F1910 Other psychoactive substance abuse, uncomplicated: Secondary | ICD-10-CM | POA: Diagnosis not present
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
## 2015-11-11 ENCOUNTER — Emergency Department (HOSPITAL_COMMUNITY)
Admission: EM | Admit: 2015-11-11 | Discharge: 2015-11-11 | Disposition: A | Discharge status: Home / Self Care | Payer: Medicare Other | Attending: Physician Assistant | Admitting: Physician Assistant

## 2015-11-11 ENCOUNTER — Encounter (HOSPITAL_COMMUNITY): Payer: Self-pay

## 2015-11-11 DIAGNOSIS — Y929 Unspecified place or not applicable: Secondary | ICD-10-CM | POA: Insufficient documentation

## 2015-11-11 DIAGNOSIS — S61011A Laceration without foreign body of right thumb without damage to nail, initial encounter: Secondary | ICD-10-CM | POA: Diagnosis not present

## 2015-11-11 DIAGNOSIS — J449 Chronic obstructive pulmonary disease, unspecified: Secondary | ICD-10-CM | POA: Insufficient documentation

## 2015-11-11 DIAGNOSIS — I10 Essential (primary) hypertension: Secondary | ICD-10-CM | POA: Insufficient documentation

## 2015-11-11 DIAGNOSIS — S61411A Laceration without foreign body of right hand, initial encounter: Secondary | ICD-10-CM

## 2015-11-11 DIAGNOSIS — Y939 Activity, unspecified: Secondary | ICD-10-CM | POA: Insufficient documentation

## 2015-11-11 DIAGNOSIS — Y999 Unspecified external cause status: Secondary | ICD-10-CM | POA: Diagnosis not present

## 2015-11-11 DIAGNOSIS — F17200 Nicotine dependence, unspecified, uncomplicated: Secondary | ICD-10-CM | POA: Diagnosis not present

## 2015-11-11 DIAGNOSIS — Z96641 Presence of right artificial hip joint: Secondary | ICD-10-CM | POA: Insufficient documentation

## 2015-11-11 DIAGNOSIS — W260XXA Contact with knife, initial encounter: Secondary | ICD-10-CM | POA: Insufficient documentation

## 2015-11-11 MED ORDER — LIDOCAINE HCL (PF) 1 % IJ SOLN
5.0000 mL | Freq: Once | INTRAMUSCULAR | Status: AC
  Administered 2015-11-11: 5 mL
  Filled 2015-11-11: qty 5

## 2015-11-11 NOTE — Discharge Instructions (Signed)
Keep wound clean with mild soap and water. Keep area covered with a topical antibiotic ointment and bandage, keep bandage dry, and do not submerge in water for 24 hours. Ice and elevate for additional pain relief and swelling. Alternate between ibuprofen and Tylenol for additional pain relief. Follow up with your primary care doctor or the [facility] in approximately 7 days for wound recheck and suture removal. Monitor area for signs of infection to include, but not limited to: increasing pain, spreading redness, drainage/pus, worsening swelling, or fevers. Return to emergency department for emergent changing or worsening symptoms. ° ° °WOUND CARE °Keep area clean and dry for 24 hours. Do not remove bandage, if applied. °After 24 hours,you should change it at least once a day. Also, change the dressing if it becomes wet or dirty, or as directed by your caregiver.  °Wash the wound with soap and water 2 times a day. Rinse the wound off with water to remove all soap. Pat the wound dry with a clean towel.  °You may shower as usual after the first 24 hours. Do not soak the wound in water until the sutures are removed.  °Once the wound has healed, scarring can be minimized by covering the wound with sunscreen during the day for 1 full year. °Do not apply any ointments or creams to the wound while stitches/staples are in place, as this may cause delayed healing. °Return if you experience any of the following signs of infection: Swelling, redness, pus drainage, streaking, fever >101.0 °Return if you experience excessive bleeding that does not stop after 15-20 minutes of constant, firm pressure. ° °

## 2015-11-11 NOTE — ED Notes (Signed)
Declined W/C at D/C and was escorted to lobby by RN. 

## 2015-11-11 NOTE — ED Notes (Signed)
Suture cart set up at bedside  

## 2015-11-11 NOTE — ED Triage Notes (Signed)
Patient here with right thumb laceration after cutting with knife this am, bleeding controlled, saline applied

## 2015-11-11 NOTE — ED Provider Notes (Signed)
[facility]-EMERGENCY DEPT Provider Note   CSN: [id number] Arrival date & time: 11/11/15  1220 By signing my name below, I, [name], attest that this documentation has been prepared under the direction and in the presence of [facility], PA-C. Electronically Signed: [name], ED Scribe. 11/11/15. 1:55 PM.  History   Chief Complaint Chief Complaint  Patient presents with  . FINGER LACERATION   HPI Comments: [name] is a 40 y.o. male who presents to the Emergency Department complaining of a laceration to his right thumb s/p mechanical injury this morning. Pt states he cut his finger with a knife. Bleeding controlled with saline applied. He did not take any OTC medications PTA. Pt is right-handed. He denies numbness to the area. Pt's Tdap is UTD.   The history is provided by the patient. No language interpreter was used.    Past Medical History:  Diagnosis Date  . Anxiety   . COPD (chronic obstructive pulmonary disease) (HCC)   . Depression   . Hypertension     Patient Active Problem List   Diagnosis Date Noted  . SOB (shortness of breath) 11/03/2013  . COPD (chronic obstructive pulmonary disease) (HCC)   . Hypertension   . Chronic pain syndrome 02/08/2012  . Depression   . Panic attacks 09/16/2006    Past Surgical History:  Procedure Laterality Date  . Femur bone graft  2011   Cadaver-- [facility] Mickle [facility]umb at [facility]  . HIP ARTHROSCOPY  [id number]   right hip  . JOINT REPLACEMENT  2009   Right THR-- [name]       Home Medications    Prior to Admission medications   Medication Sig Start Date End Date Taking? Authorizing Provider  albuterol (PROVENTIL HFA;VENTOLIN HFA) 108 (90 BASE) MCG/ACT inhaler Inhale 2 puffs into the lungs every 6 (six) hours as needed for wheezing or shortness of breath. 02/27/14   Karie Schwalbe, MD  CHLOROTHIAZIDE PO Take 20 mg by mouth.    Historical Provider, MD  chlorthalidone (HYGROTON) 25 MG tablet Take 1 tablet (25 mg total) by mouth daily.  03/27/13   Karie Schwalbe, MD  EPINEPHrine (EPIPEN) 0.3 mg/0.3 mL SOAJ injection Inject 0.3 mLs (0.3 mg total) into the muscle as needed. 10/16/12   Linwood Dibbles, MD  gabapentin (NEURONTIN) 300 MG capsule Take 600-1,200 capsules by mouth 3 (three) times daily. Take 600 mg morning and noon;  Take 1200 mg at bedtime 10/16/13   Historical Provider, MD  HYDROcodone-acetaminophen (NORCO) 5-325 MG per tablet Take 1 tablet by mouth every 6 (six) hours as needed for moderate pain. 08/25/14   Elvina Sidle, MD  ibuprofen (ADVIL,MOTRIN) 800 MG tablet Take 800 mg by mouth 3 (three) times daily as needed for pain.     Historical Provider, MD  oxyCODONE-acetaminophen (PERCOCET/ROXICET) 5-325 MG tablet Take 1 tablet by mouth every 4 (four) hours as needed for severe pain. 05/27/15   [name], PA-C  penicillin v potassium (VEETID) 500 MG tablet Take 1 tablet (500 mg total) by mouth 3 (three) times daily. 08/25/14   Elvina Sidle, MD    Family History Family History  Problem Relation Age of Onset  . Heart disease Father   . Birth defects Other   . Heart disease Other   . Diabetes Other   . Hypertension Other     Social History Social History  Substance Use Topics  . Smoking status: Current Every Day Smoker  . Smokeless tobacco: Never Used  . Alcohol use Yes  Comment: occ     Allergies   Bee venom; Fluoxetine hcl; Methocarbamol; and Sertraline hcl   Review of Systems Review of Systems  Constitutional: Negative for fever.  Skin: Positive for wound.  Neurological: Negative for numbness.     Physical Exam Updated Vital Signs BP 135/87 (BP Location: Left Arm)   Pulse 78   Temp 98.5 F (36.9 C) (Oral)   Resp 16   Ht 5\' 9"  (1.753 m)   Wt 190 lb (86.2 kg)   SpO2 97%   BMI 28.06 kg/m   Physical Exam  Constitutional: He is oriented to person, place, and time. He appears well-developed and well-nourished. No distress.  HENT:  Head: Normocephalic and atraumatic.  Cardiovascular:  Normal rate, regular rhythm and normal heart sounds.   No murmur heard. 2+ radial pulse.   Pulmonary/Chest: Effort normal and breath sounds normal. No respiratory distress.  Musculoskeletal: Normal range of motion.  Neurological: He is alert and oriented to person, place, and time.  Right upper extremity NVI.   Skin: Skin is warm and dry.  Right thumb 3 cm U-shaped laceration  Psychiatric: He has a normal mood and affect.  Nursing note and vitals reviewed.    ED Treatments / Results  DIAGNOSTIC STUDIES: Oxygen Saturation is 97% on RA, adequate by my interpretation.    COORDINATION OF CARE: 1:55 PM Discussed treatment plan with pt at bedside which includes laceration repair and pt agreed to plan.  Labs (all labs ordered are listed, but only abnormal results are displayed) Labs Reviewed - No data to display  EKG  EKG Interpretation None       Radiology No results found.  Procedures .Marland KitchenLaceration Repair Date/Time: 11/11/2015 1:56 PM Performed by: Janyth Contes Authorized by: Janyth Contes   Consent:    Consent obtained:  Verbal   Consent given by:  Patient Anesthesia (see MAR for exact dosages):    Anesthesia method:  Local infiltration   Local anesthetic:  Lidocaine 1% w/o epi Laceration details:    Location:  Finger   Finger location:  R thumb   Length (cm):  3 Repair type:    Repair type:  Simple Pre-procedure details:    Preparation:  Patient was prepped and draped in usual sterile fashion Exploration:    Wound exploration: wound explored through full range of motion and entire depth of wound probed and visualized     Contaminated: no   Treatment:    Area cleansed with:  Saline   Amount of cleaning:  Standard   Irrigation solution:  Sterile water   Irrigation method:  Pressure wash   Visualized foreign bodies/material removed: no   Skin repair:    Repair method:  Sutures   Suture size:  6-0   Suture material:  Nylon   Suture technique:   Simple interrupted   Number of sutures:  6 Approximation:    Approximation:  Close   Vermilion border: well-aligned   Post-procedure details:    Dressing:  Sterile dressing      (including critical care time)  Medications Ordered in ED Medications - No data to display   Initial Impression / Assessment and Plan / ED Course  I have reviewed the triage vital signs and the nursing notes.  Pertinent labs & imaging results that were available during my care of the patient were reviewed by me and considered in my medical decision making (see chart for details).  Clinical Course   SANJIT MCMICHAEL presents to  ED for laceration of right thumb. Wound explored and bottom of wound seen in a bloodless field. Laceration repaired as dictated above. Patient counseled on home wound care. Follow up with PCP/urgent care or return to ER for suture removal in 7 days. Patient was urged to return to the Emergency Department for worsening pain, swelling, expanding erythema especially if it streaks away from the affected area, fever, or for any additional concerns. Patient verbalized understanding. All questions answered.   Final Clinical Impressions(s) / ED Diagnoses   Final diagnoses:  None   I personally performed the services described in this documentation, which was scribed in my presence. The recorded information has been reviewed and is accurate.  New Prescriptions New Prescriptions   No medications on file     [facility] LLCJaime Pilcher Jayleana Colberg, PA-C 11/11/15 1558    Courteney Randall AnLyn Mackuen, MD 11/12/15 1616

## 2015-11-18 ENCOUNTER — Ambulatory Visit (INDEPENDENT_AMBULATORY_CARE_PROVIDER_SITE_OTHER): Payer: Medicare Other | Admitting: Internal Medicine

## 2015-11-18 ENCOUNTER — Encounter: Payer: Self-pay | Admitting: Internal Medicine

## 2015-11-18 VITALS — BP 108/70 | HR 63 | Temp 97.8°F | Wt 191.0 lb

## 2015-11-18 DIAGNOSIS — S61011A Laceration without foreign body of right thumb without damage to nail, initial encounter: Secondary | ICD-10-CM

## 2015-11-18 DIAGNOSIS — Z23 Encounter for immunization: Secondary | ICD-10-CM | POA: Diagnosis not present

## 2015-11-18 NOTE — Progress Notes (Signed)
   Subjective:    Patient ID: Genene ChurnBrian D Coss, male    DOB: 1975-08-20, 40 y.o.   MRN: [id number]  HPI Here for suture removal  Has kept the right thumb covered Using antibiotic ointment Cleaning daily with soap and water in shower  Current Outpatient Prescriptions on File Prior to Visit  Medication Sig Dispense Refill  . albuterol (PROVENTIL HFA;VENTOLIN HFA) 108 (90 BASE) MCG/ACT inhaler Inhale 2 puffs into the lungs every 6 (six) hours as needed for wheezing or shortness of breath. 18 g 1  . CHLOROTHIAZIDE PO Take 20 mg by mouth.    . chlorthalidone (HYGROTON) 25 MG tablet Take 1 tablet (25 mg total) by mouth daily. 90 tablet 3  . EPINEPHrine (EPIPEN) 0.3 mg/0.3 mL SOAJ injection Inject 0.3 mLs (0.3 mg total) into the muscle as needed. 1 Device 1  . gabapentin (NEURONTIN) 300 MG capsule Take 600-1,200 capsules by mouth 3 (three) times daily. Take 600 mg morning and noon;  Take 1200 mg at bedtime    . ibuprofen (ADVIL,MOTRIN) 800 MG tablet Take 800 mg by mouth 3 (three) times daily as needed for pain.      No current facility-administered medications on file prior to visit.     Allergies  Allergen Reactions  . Bee Venom Anaphylaxis  . Fluoxetine Hcl     REACTION: paranoia  . Sertraline Hcl     REACTION: didn`t tolerate    Past Medical History:  Diagnosis Date  . Anxiety   . COPD (chronic obstructive pulmonary disease) (HCC)   . Depression   . Hypertension     Past Surgical History:  Procedure Laterality Date  . Femur bone graft  2011   [name]  . HIP ARTHROSCOPY  [id number]   right hip  . JOINT REPLACEMENT  2009   Right THR-- [name]    Family History  Problem Relation Age of Onset  . Heart disease Father   . Birth defects Other   . Heart disease Other   . Diabetes Other   . Hypertension Other     Social History   Social History  . Marital status: Single    Spouse name: N/A  . Number of children: 2  . Years of education: N/A    Occupational History  . Disabled since 2012     from hip   Social History Main Topics  . Smoking status: Current Every Day Smoker  . Smokeless tobacco: Never Used  . Alcohol use Yes     Comment: occ  . Drug use:     Types: IV     Comment: heroin  . Sexual activity: Not on file   Other Topics Concern  . Not on file   Social History Narrative   Legally seperated since ~2009      2 children with their mother      Review of Systems Doesn't sleep well Still disabled Appetite is okay--trying to eat better to lose weight    Objective:   Physical Exam  Constitutional: He appears well-nourished. No distress.  Skin:  Laceration around base of right thumb Well apposed No inflammation, redness or warmth          Assessment & Plan:

## 2015-11-18 NOTE — Assessment & Plan Note (Signed)
Well healed Sutures removed and steristrips placed Discussed taking care till healing complete

## 2015-11-18 NOTE — Addendum Note (Signed)
Addended by: Eual FinesBRIDGES, SHANNON P on: 11/18/2015 04:38 PM   Modules accepted: Orders

## 2015-11-18 NOTE — Progress Notes (Signed)
Pre visit review using our clinic review tool, if applicable. No additional management support is needed unless otherwise documented below in the visit note. 

## 2016-05-20 ENCOUNTER — Encounter: Payer: Medicare Other | Admitting: Internal Medicine

## 2016-08-25 DIAGNOSIS — M1711 Unilateral primary osteoarthritis, right knee: Secondary | ICD-10-CM | POA: Diagnosis not present

## 2016-08-25 DIAGNOSIS — G8929 Other chronic pain: Secondary | ICD-10-CM | POA: Diagnosis not present

## 2016-08-25 DIAGNOSIS — M25561 Pain in right knee: Secondary | ICD-10-CM | POA: Diagnosis not present

## 2016-08-28 ENCOUNTER — Ambulatory Visit: Payer: Self-pay | Admitting: Physician Assistant

## 2016-08-29 ENCOUNTER — Ambulatory Visit: Payer: Self-pay | Admitting: Physician Assistant

## 2016-09-07 ENCOUNTER — Other Ambulatory Visit: Payer: Self-pay | Admitting: Orthopedic Surgery

## 2016-09-07 DIAGNOSIS — R52 Pain, unspecified: Secondary | ICD-10-CM

## 2016-09-07 DIAGNOSIS — R29898 Other symptoms and signs involving the musculoskeletal system: Secondary | ICD-10-CM

## 2016-09-07 DIAGNOSIS — R609 Edema, unspecified: Secondary | ICD-10-CM

## 2016-09-09 ENCOUNTER — Other Ambulatory Visit: Payer: Medicare Other

## 2016-09-20 ENCOUNTER — Ambulatory Visit
Admission: RE | Admit: 2016-09-20 | Discharge: 2016-09-20 | Disposition: A | Discharge status: Home / Self Care | Payer: Medicare Other | Source: Ambulatory Visit | Attending: Orthopedic Surgery | Admitting: Orthopedic Surgery

## 2016-09-20 DIAGNOSIS — R609 Edema, unspecified: Secondary | ICD-10-CM

## 2016-09-20 DIAGNOSIS — R29898 Other symptoms and signs involving the musculoskeletal system: Secondary | ICD-10-CM

## 2016-09-20 DIAGNOSIS — R52 Pain, unspecified: Secondary | ICD-10-CM

## 2016-09-20 DIAGNOSIS — M25561 Pain in right knee: Secondary | ICD-10-CM | POA: Diagnosis not present

## 2016-10-27 DIAGNOSIS — G8929 Other chronic pain: Secondary | ICD-10-CM | POA: Diagnosis not present

## 2016-10-27 DIAGNOSIS — M25561 Pain in right knee: Secondary | ICD-10-CM | POA: Diagnosis not present

## 2016-10-27 DIAGNOSIS — M2351 Chronic instability of knee, right knee: Secondary | ICD-10-CM | POA: Diagnosis not present

## 2017-01-04 ENCOUNTER — Emergency Department (HOSPITAL_COMMUNITY)
Admission: EM | Admit: 2017-01-04 | Discharge: 2017-01-04 | Disposition: A | Discharge status: Home / Self Care | Payer: Medicare Other | Attending: Emergency Medicine | Admitting: Emergency Medicine

## 2017-01-04 ENCOUNTER — Encounter (HOSPITAL_COMMUNITY): Payer: Self-pay | Admitting: Nurse Practitioner

## 2017-01-04 DIAGNOSIS — T401X4A Poisoning by heroin, undetermined, initial encounter: Secondary | ICD-10-CM | POA: Insufficient documentation

## 2017-01-04 DIAGNOSIS — F419 Anxiety disorder, unspecified: Secondary | ICD-10-CM | POA: Insufficient documentation

## 2017-01-04 DIAGNOSIS — J449 Chronic obstructive pulmonary disease, unspecified: Secondary | ICD-10-CM | POA: Insufficient documentation

## 2017-01-04 DIAGNOSIS — Z79899 Other long term (current) drug therapy: Secondary | ICD-10-CM | POA: Insufficient documentation

## 2017-01-04 DIAGNOSIS — Z96641 Presence of right artificial hip joint: Secondary | ICD-10-CM | POA: Diagnosis not present

## 2017-01-04 DIAGNOSIS — I10 Essential (primary) hypertension: Secondary | ICD-10-CM | POA: Insufficient documentation

## 2017-01-04 DIAGNOSIS — F17200 Nicotine dependence, unspecified, uncomplicated: Secondary | ICD-10-CM | POA: Diagnosis not present

## 2017-01-04 DIAGNOSIS — F1110 Opioid abuse, uncomplicated: Secondary | ICD-10-CM

## 2017-01-04 DIAGNOSIS — F329 Major depressive disorder, single episode, unspecified: Secondary | ICD-10-CM | POA: Insufficient documentation

## 2017-01-04 DIAGNOSIS — R531 Weakness: Secondary | ICD-10-CM | POA: Diagnosis not present

## 2017-01-04 DIAGNOSIS — R404 Transient alteration of awareness: Secondary | ICD-10-CM | POA: Diagnosis not present

## 2017-01-04 MED ORDER — NALOXONE HCL 4 MG/0.1ML NA LIQD: NASAL | 0 refills | Status: DC

## 2017-01-04 NOTE — ED Triage Notes (Signed)
Pt is presented by medics after his friends called to report he used a"large amount of heroine." Per bedside report, he was unconscious but responsive to verbal stimuli. Reports IVDU hx with incidences of narcan being administered to reverse opioid induced OD.

## 2017-01-04 NOTE — ED Notes (Signed)
Bed: ZO10WA31 Expected date:  Expected time:  Means of arrival:  Comments: EMS-IV drug use

## 2017-01-04 NOTE — ED Notes (Signed)
Pt states he is "feeling well, and wishes to be discharged." He has also expressed the same to the attending EDP. We have offered further help, advised him to call for a ride home to facilitate safe discharge. Pt has  Verbalized understanding.

## 2017-01-04 NOTE — ED Provider Notes (Signed)
[facility]-EMERGENCY DEPT Provider Note   CSN: [id number] Arrival date & time: 01/04/17  1311     History   Chief Complaint Chief Complaint  Patient presents with  . Drug Overdose    HPI [name] is a 41 y.o. male.  41 year old male with long-standing history of heroin abuse presents after reported heroin overdose.  Reports using heroin this morning (IVDU).  Apparently his friends who were with him felt that he had overdosed and sent him to the ED for evaluation.  Upon arrival to the ED he is without complaint.  He is not in respiratory distress.  He is awake and alert.  He declines further workup at this time.  Patient reports that he has been through detox programs in the past.  He declines repeat detox evaluation.  He he denies suicidal ideation or homicidal ideation.  He does not appear to be a threat to himself or to others.   He is without medical complaint at this time.   The history is provided by the patient.  Drug Overdose  This is a recurrent problem. The current episode started 3 to 5 hours ago. The problem occurs constantly. The problem has been resolved. Pertinent negatives include no chest pain, no abdominal pain, no headaches and no shortness of breath. Nothing aggravates the symptoms. Nothing relieves the symptoms. He has tried nothing for the symptoms.    Past Medical History:  Diagnosis Date  . Anxiety   . COPD (chronic obstructive pulmonary disease) (HCC)   . Depression   . Hypertension     Patient Active Problem List   Diagnosis Date Noted  . Laceration of right thumb 11/18/2015  . SOB (shortness of breath) 11/03/2013  . COPD (chronic obstructive pulmonary disease) (HCC)   . Hypertension   . Chronic pain syndrome 02/08/2012  . Depression   . Panic attacks 09/16/2006    Past Surgical History:  Procedure Laterality Date  . Femur bone graft  2011   [name]  . HIP ARTHROSCOPY  [id number]   right hip    . JOINT REPLACEMENT  2009   Right THR-- [name]       Home Medications    Prior to Admission medications   Medication Sig Start Date End Date Taking? Authorizing Provider  albuterol (PROVENTIL HFA;VENTOLIN HFA) 108 (90 BASE) MCG/ACT inhaler Inhale 2 puffs into the lungs every 6 (six) hours as needed for wheezing or shortness of breath. 02/27/14   Tillman AbideLetvak, Richard I, MD  CHLOROTHIAZIDE PO Take 20 mg by mouth.    [provider]  chlorthalidone (HYGROTON) 25 MG tablet Take 1 tablet (25 mg total) by mouth daily. 03/27/13   Karie SchwalbeLetvak, Richard I, MD  EPINEPHrine (EPIPEN) 0.3 mg/0.3 mL SOAJ injection Inject 0.3 mLs (0.3 mg total) into the muscle as needed. 10/16/12   Linwood DibblesKnapp, Jon, MD  gabapentin (NEURONTIN) 300 MG capsule Take 600-1,200 capsules by mouth 3 (three) times daily. Take 600 mg morning and noon;  Take 1200 mg at bedtime 10/16/13   [provider]  ibuprofen (ADVIL,MOTRIN) 800 MG tablet Take 800 mg by mouth 3 (three) times daily as needed for pain.     [provider]  naloxone [facility](NARCAN) nasal spray 4 mg/0.1 mL Inject into nostril in the event of a narcotic overdose. 01/04/17   Wynetta FinesMessick, Peter C, MD    Family History Family History  Problem Relation Age of Onset  . Heart disease Father   . Birth  defects Other   . Heart disease Other   . Diabetes Other   . Hypertension Other     Social History Social History   Tobacco Use  . Smoking status: Current Every Day Smoker  . Smokeless tobacco: Never Used  Substance Use Topics  . Alcohol use: Yes    Comment: occ  . Drug use: Yes    Types: IV    Comment: heroin     Allergies   Bee venom; Fluoxetine hcl; and Sertraline hcl   Review of Systems Review of Systems  Respiratory: Negative for shortness of breath.   Cardiovascular: Negative for chest pain.  Gastrointestinal: Negative for abdominal pain.  Neurological: Negative for headaches.  All other systems reviewed and are negative.    Physical  Exam Updated Vital Signs BP (!) 93/53 (BP Location: Right Arm) Comment: 94/52  Pulse 70   Temp 98.4 F (36.9 C) (Oral)   Resp 14   SpO2 97%   Physical Exam  Constitutional: He is oriented to person, place, and time. He appears well-developed and well-nourished. No distress.  HENT:  Head: Normocephalic and atraumatic.  Mouth/Throat: Oropharynx is clear and moist.  Eyes: Conjunctivae and EOM are normal. Pupils are equal, round, and reactive to light.  Neck: Normal range of motion. Neck supple.  Cardiovascular: Normal rate, regular rhythm and normal heart sounds.  Pulmonary/Chest: Effort normal and breath sounds normal. No respiratory distress.  Abdominal: Soft. He exhibits no distension. There is no tenderness.  Musculoskeletal: Normal range of motion. He exhibits no edema or deformity.  Neurological: He is alert and oriented to person, place, and time.  Skin: Skin is warm and dry.  Psychiatric: He has a normal mood and affect.  Nursing note and vitals reviewed.    ED Treatments / Results  Labs (all labs ordered are listed, but only abnormal results are displayed) Labs Reviewed - No data to display  EKG  EKG Interpretation None       Radiology No results found.  Procedures Procedures (including critical care time)  Medications Ordered in ED Medications - No data to display   Initial Impression / Assessment and Plan / ED Course  I have reviewed the triage vital signs and the nursing notes.  Pertinent labs & imaging results that were available during my care of the patient were reviewed by me and considered in my medical decision making (see chart for details).     MSE Complete  Patient is presenting following a heroin overdose.  He does not require Narcan at this time.  He desires discharge home.  He declines evaluation for detox or resources for detox.  He does not appear to be a threat to himself or others. Strict return instructions given. Close FU advised.    Final Clinical Impressions(s) / ED Diagnoses   Final diagnoses:  Heroin abuse [facility](HCC)    ED Discharge Orders        Ordered    naloxone [facility](NARCAN) nasal spray 4 mg/0.1 mL     01/04/17 1351       Wynetta FinesMessick, Peter C, MD 01/04/17 1356

## 2017-02-10 ENCOUNTER — Emergency Department (HOSPITAL_COMMUNITY): Payer: Medicare Other

## 2017-02-10 ENCOUNTER — Emergency Department (HOSPITAL_COMMUNITY)
Admission: EM | Admit: 2017-02-10 | Discharge: 2017-02-10 | Disposition: A | Discharge status: Home / Self Care | Payer: Medicare Other | Attending: Emergency Medicine | Admitting: Emergency Medicine

## 2017-02-10 ENCOUNTER — Encounter (HOSPITAL_COMMUNITY): Payer: Self-pay | Admitting: Emergency Medicine

## 2017-02-10 DIAGNOSIS — R404 Transient alteration of awareness: Secondary | ICD-10-CM | POA: Diagnosis not present

## 2017-02-10 DIAGNOSIS — R0602 Shortness of breath: Secondary | ICD-10-CM | POA: Diagnosis not present

## 2017-02-10 DIAGNOSIS — Z036 Encounter for observation for suspected toxic effect from ingested substance ruled out: Secondary | ICD-10-CM | POA: Diagnosis not present

## 2017-02-10 DIAGNOSIS — Z5321 Procedure and treatment not carried out due to patient leaving prior to being seen by health care provider: Secondary | ICD-10-CM | POA: Insufficient documentation

## 2017-02-10 DIAGNOSIS — R030 Elevated blood-pressure reading, without diagnosis of hypertension: Secondary | ICD-10-CM | POA: Diagnosis not present

## 2017-02-10 LAB — COMPREHENSIVE METABOLIC PANEL
ALBUMIN: 3.4 g/dL — AB (ref 3.5–5.0)
ALK PHOS: 72 U/L (ref 38–126)
ALT: 57 U/L (ref 17–63)
ANION GAP: 8 (ref 5–15)
AST: 76 U/L — AB (ref 15–41)
BILIRUBIN TOTAL: 0.4 mg/dL (ref 0.3–1.2)
BUN: 8 mg/dL (ref 6–20)
CALCIUM: 8.9 mg/dL (ref 8.9–10.3)
CO2: 26 mmol/L (ref 22–32)
CREATININE: 0.94 mg/dL (ref 0.61–1.24)
Chloride: 105 mmol/L (ref 101–111)
GFR calc Af Amer: >60 mL/min (ref >60)
GFR calc non Af Amer: >60 mL/min (ref >60)
GLUCOSE: 138 mg/dL — AB (ref 65–99)
Potassium: 4.6 mmol/L (ref 3.5–5.1)
Sodium: 139 mmol/L (ref 135–145)
TOTAL PROTEIN: 6.8 g/dL (ref 6.5–8.1)

## 2017-02-10 LAB — CBC
HEMATOCRIT: 42 % (ref 39.0–52.0)
HEMOGLOBIN: 14.3 g/dL (ref 13.0–17.0)
MCH: 29.9 pg (ref 26.0–34.0)
MCHC: 34 g/dL (ref 30.0–36.0)
MCV: 87.7 fL (ref 78.0–100.0)
Platelets: 207 10*3/uL (ref 150–400)
RBC: 4.79 MIL/uL (ref 4.22–5.81)
RDW: 13.3 % (ref 11.5–15.5)
WBC: 9 10*3/uL (ref 4.0–10.5)

## 2017-02-10 NOTE — ED Provider Notes (Signed)
Patient placed in Quick Look pathway, seen and evaluated for chief complaint of overdose. Found by EMS unresponsive around 1300. Given Narcan IN and then IV with response. Baseline currently. Denies usage and states "does not remember."  White powder found in room by father. Pertinent H&P findings include no CP/SOB/ABD pain. No N/V. No headache. Notes cough/congestion. Pt smokes 1 PPD.  Based on initial evaluation, labs are indicated and radiology studies are indicated.  Patient counseled on process, plan, and necessity for staying for completing the evaluation.    Audry PiliMohr, William Schake, PA-C 02/10/17 1359    Margarita Grizzleay, Danielle, MD 02/10/17 [phone]

## 2017-02-10 NOTE — ED Notes (Signed)
Upon RN arrival unable to locate this patient in waiting room, triage or immediately outside. Discussed with triage RN due to situation will contact police for house check due to condition and IV access

## 2017-02-10 NOTE — ED Notes (Signed)
Pt called x2 for vitals. No answer 

## 2017-02-10 NOTE — ED Provider Notes (Signed)
Patient left after triage. I didn't establish contact with patient    [name], [name] Hsienta, MD 02/10/17 [phone]

## 2017-02-10 NOTE — ED Triage Notes (Signed)
Per [facility]: Patient found on floor of home by his father, unresponsive and not breathing, upon EMS arrival, RR 3-4/min - given 0.5mg  Narcan IN and another 0.5mg  IV (13:01), in patient woke up after receiving. Per EMS, father reported that patient had "bag of white powder in his room." Pupils were pinpoint. EMS VS: 88% RA, placed on 2L O2 [place] - went up to 91-92%, pt reports hx of COPD, HR 84, RR 16, CBG 269. Patient currently A&O x 4, resp e/u RR 18. 20g. PIV L wrist. Patient denies any drug use. Denies any symptoms at this time.

## 2017-02-10 NOTE — ED Notes (Signed)
Pt called x1 for vitals. No answer 

## 2017-03-04 ENCOUNTER — Other Ambulatory Visit: Payer: Self-pay

## 2017-03-04 ENCOUNTER — Emergency Department
Admission: EM | Admit: 2017-03-04 | Discharge: 2017-03-04 | Disposition: A | Discharge status: Home / Self Care | Payer: Medicare Other | Attending: Emergency Medicine | Admitting: Emergency Medicine

## 2017-03-04 DIAGNOSIS — R05 Cough: Secondary | ICD-10-CM | POA: Diagnosis not present

## 2017-03-04 DIAGNOSIS — F17210 Nicotine dependence, cigarettes, uncomplicated: Secondary | ICD-10-CM | POA: Diagnosis not present

## 2017-03-04 DIAGNOSIS — J111 Influenza due to unidentified influenza virus with other respiratory manifestations: Secondary | ICD-10-CM

## 2017-03-04 DIAGNOSIS — J029 Acute pharyngitis, unspecified: Secondary | ICD-10-CM | POA: Diagnosis present

## 2017-03-04 DIAGNOSIS — I10 Essential (primary) hypertension: Secondary | ICD-10-CM | POA: Diagnosis not present

## 2017-03-04 DIAGNOSIS — J101 Influenza due to other identified influenza virus with other respiratory manifestations: Secondary | ICD-10-CM | POA: Insufficient documentation

## 2017-03-04 DIAGNOSIS — R531 Weakness: Secondary | ICD-10-CM | POA: Diagnosis not present

## 2017-03-04 DIAGNOSIS — J449 Chronic obstructive pulmonary disease, unspecified: Secondary | ICD-10-CM | POA: Diagnosis not present

## 2017-03-04 DIAGNOSIS — Z79899 Other long term (current) drug therapy: Secondary | ICD-10-CM | POA: Diagnosis not present

## 2017-03-04 LAB — INFLUENZA PANEL BY PCR (TYPE A & B)
INFLAPCR: POSITIVE — AB
INFLBPCR: NEGATIVE

## 2017-03-04 MED ORDER — KETOROLAC TROMETHAMINE 60 MG/2ML IM SOLN
60.0000 mg | Freq: Once | INTRAMUSCULAR | Status: AC
  Administered 2017-03-04: 60 mg via INTRAMUSCULAR
  Filled 2017-03-04: qty 2

## 2017-03-04 MED ORDER — PSEUDOEPH-BROMPHEN-DM 30-2-10 MG/5ML PO SYRP: 5.0000 mL | ORAL_SOLUTION | Freq: Four times a day (QID) | ORAL | 0 refills | Status: DC | PRN

## 2017-03-04 MED ORDER — IBUPROFEN 600 MG PO TABS: 600.0000 mg | ORAL_TABLET | Freq: Three times a day (TID) | ORAL | 0 refills | Status: DC | PRN

## 2017-03-04 MED ORDER — OSELTAMIVIR PHOSPHATE 75 MG PO CAPS: 75.0000 mg | ORAL_CAPSULE | Freq: Two times a day (BID) | ORAL | 0 refills | Status: AC

## 2017-03-04 NOTE — ED Notes (Signed)
See triage note. States he feels weak, body aches, sore throat and cough  sxs' started about 1-2 weeks ago  Unsure of fever  Afebrile on arrival

## 2017-03-04 NOTE — ED Provider Notes (Signed)
[facility] Emergency Department Provider Note   ____________________________________________   First MD Initiated Contact with Patient 03/04/17 1552     (approximate)  I have reviewed the triage vital signs and the nursing notes.   HISTORY  Chief Complaint Influenza    HPI [name] is a 42 y.o. male patient complain of weakness, body aches, sneezing, coughing, runny nose, and sore throat.  Onset of complaint for 2 days.  Patient denies nausea, vomiting, diarrhea.  Patient stated mild relief of ibuprofen for body aches.  Patient denies fever and chills.  Past Medical History:  Diagnosis Date  . Anxiety   . COPD (chronic obstructive pulmonary disease) (HCC)   . Depression   . Hypertension     Patient Active Problem List   Diagnosis Date Noted  . Laceration of right thumb 11/18/2015  . SOB (shortness of breath) 11/03/2013  . COPD (chronic obstructive pulmonary disease) (HCC)   . Hypertension   . Chronic pain syndrome 02/08/2012  . Depression   . Panic attacks 09/16/2006    Past Surgical History:  Procedure Laterality Date  . Femur bone graft  2011   Cadaver-- Dr Mickle [facility]umb at [facility]  . HIP ARTHROSCOPY  [id number]   right hip  . JOINT REPLACEMENT  2009   Right THR-- [name]    Prior to Admission medications   Medication Sig Start Date End Date Taking? Authorizing Provider  acetaminophen (TYLENOL) 500 MG tablet Take 500 mg by mouth every 6 (six) hours as needed for moderate pain.    [provider]  albuterol (PROVENTIL HFA;VENTOLIN HFA) 108 (90 BASE) MCG/ACT inhaler Inhale 2 puffs into the lungs every 6 (six) hours as needed for wheezing or shortness of breath. Patient not taking: Reported on 01/04/2017 02/27/14   Tillman Abide I, MD  brompheniramine-pseudoephedrine-DM 30-2-10 MG/5ML syrup Take 5 mLs by mouth 4 (four) times daily as needed. 03/04/17   Joni Reining, PA-C  CHLOROTHIAZIDE PO Take 20 mg by mouth.    [provider]  chlorthalidone (HYGROTON) 25 MG tablet Take 1 tablet (25 mg total) by mouth daily. Patient not taking: Reported on 01/04/2017 03/27/13   Karie Schwalbe, MD  clonazePAM (KLONOPIN) 1 MG tablet TAKE ONE TABLET BY MOUTH TWICE DAILY AS NEEDED FOR SEVERE anxiety 01/01/17   [provider]  EPINEPHrine (EPIPEN) 0.3 mg/0.3 mL SOAJ injection Inject 0.3 mLs (0.3 mg total) into the muscle as needed. 10/16/12   Linwood Dibbles, MD  gabapentin (NEURONTIN) 300 MG capsule Take 300-1,200 mg by mouth 4 (four) times daily. Take 1 capsule (300 mg) TID and Take 4 capsules (1200 mg) at bedtime. 10/16/13   [provider]  ibuprofen (ADVIL,MOTRIN) 600 MG tablet Take 1 tablet (600 mg total) by mouth every 8 (eight) hours as needed. 03/04/17   Joni Reining, PA-C  ibuprofen (ADVIL,MOTRIN) 800 MG tablet Take 800 mg by mouth 3 (three) times daily as needed for pain.     [provider]  naloxone [facility]) nasal spray 4 mg/0.1 mL Inject into nostril in the event of a narcotic overdose. 01/04/17   Wynetta Fines, MD  oseltamivir (TAMIFLU) 75 MG capsule Take 1 capsule (75 mg total) by mouth 2 (two) times daily for 5 days. 03/04/17 03/09/17  Joni Reining, PA-C    Allergies Bee venom; Fluoxetine hcl; and Sertraline hcl  Family History  Problem Relation Age of Onset  . Heart disease Father   . Birth defects Other   .  Heart disease Other   . Diabetes Other   . Hypertension Other     Social History Social History   Tobacco Use  . Smoking status: Current Every Day Smoker    Packs/day: 1.00    Types: Cigarettes  . Smokeless tobacco: Never Used  Substance Use Topics  . Alcohol use: No    Frequency: Never  . Drug use: No    Comment: heroin    Review of Systems Constitutional: No fever/chills Eyes: No visual changes. ENT: No sore throat. Cardiovascular: Denies chest pain. Respiratory: Denies shortness of breath. Gastrointestinal: No abdominal pain.  No nausea, no vomiting.   No diarrhea.  No constipation. Genitourinary: Negative for dysuria. Musculoskeletal: Negative for back pain. Skin: Negative for rash. Neurological: Negative for headaches, focal weakness or numbness. Psychiatric:Anxiety and depression. Endocrine:Hypertension Allergic/Immunilogical: See medication list ____________________________________________   PHYSICAL EXAM:  VITAL SIGNS: ED Triage Vitals  Enc Vitals Group     BP 03/04/17 1535 109/69     Pulse Rate 03/04/17 1535 77     Resp 03/04/17 1535 18     Temp 03/04/17 1535 98.8 F (37.1 C)     Temp Source 03/04/17 1535 Oral     SpO2 03/04/17 1535 95 %     Weight 03/04/17 1535 200 lb (90.7 kg)     Height 03/04/17 1535 5' 9.5" (1.765 m)     Head Circumference --      Peak Flow --      Pain Score 03/04/17 1537 9     Pain Loc --      Pain Edu? --      Excl. in GC? --    Constitutional: Alert and oriented. Well appearing and in no acute distress. Nose: Edematous nasal turbinates clear rhinorrhea Mouth/Throat: Mucous membranes are moist.  Oropharynx non-erythematous.  Postnasal drainage Neck: No stridor. Hematological/Lymphatic/Immunilogical: No cervical lymphadenopathy. Cardiovascular: Normal rate, regular rhythm. Grossly normal heart sounds.  Good peripheral circulation. Respiratory: Normal respiratory effort.  No retractions. Lungs CTAB. Neurologic:  Normal speech and language. No gross focal neurologic deficits are appreciated. No gait instability. Skin:  Skin is warm, dry and intact. No rash noted. Psychiatric: Mood and affect are normal. Speech and behavior are normal.  ____________________________________________   LABS (all labs ordered are listed, but only abnormal results are displayed)  Labs Reviewed  INFLUENZA PANEL BY PCR (TYPE A & B) - Abnormal; Notable for the following components:      Result Value   Influenza A By PCR POSITIVE (*)    All other components within normal limits    ____________________________________________  EKG   ____________________________________________  RADIOLOGY  ED MD interpretation:    Official radiology report(s): No results found.  ____________________________________________   PROCEDURES  Procedure(s) performed: None  Procedures  Critical Care performed: No  ____________________________________________   INITIAL IMPRESSION / ASSESSMENT AND PLAN / ED COURSE  As part of my medical decision making, I reviewed the following data within the electronic MEDICAL RECORD NUMBER    Viral illness secondary to influenza A.  Patient given discharge care instruction.  Patient advised take medication as directed and follow-up PCP.      ____________________________________________   FINAL CLINICAL IMPRESSION(S) / ED DIAGNOSES  Final diagnoses:  Influenza     ED Discharge Orders        Ordered    oseltamivir (TAMIFLU) 75 MG capsule  2 times daily     03/04/17 1655    brompheniramine-pseudoephedrine-DM 30-2-10 MG/5ML syrup  4 times  daily PRN     03/04/17 1655    ibuprofen (ADVIL,MOTRIN) 600 MG tablet  Every 8 hours PRN     03/04/17 1655       Note:  This document was prepared using Dragon voice recognition software and may include unintentional dictation errors.    Joni ReiningSmith, Ronin Crager K, PA-C 03/04/17 1703    Phineas SemenGoodman, Graydon, MD 03/04/17 [phone]

## 2017-03-04 NOTE — ED Triage Notes (Signed)
Pt c/o weakness, body aches, sneezing, coughing, runny nose, sore throat

## 2017-04-15 DIAGNOSIS — G894 Chronic pain syndrome: Secondary | ICD-10-CM | POA: Diagnosis not present

## 2017-04-15 DIAGNOSIS — M5136 Other intervertebral disc degeneration, lumbar region: Secondary | ICD-10-CM | POA: Diagnosis not present

## 2017-04-15 DIAGNOSIS — F411 Generalized anxiety disorder: Secondary | ICD-10-CM | POA: Diagnosis not present

## 2017-04-15 DIAGNOSIS — E7849 Other hyperlipidemia: Secondary | ICD-10-CM | POA: Diagnosis not present

## 2017-04-15 DIAGNOSIS — Z23 Encounter for immunization: Secondary | ICD-10-CM | POA: Diagnosis not present

## 2017-04-15 DIAGNOSIS — Z131 Encounter for screening for diabetes mellitus: Secondary | ICD-10-CM | POA: Diagnosis not present

## 2017-04-15 DIAGNOSIS — F17210 Nicotine dependence, cigarettes, uncomplicated: Secondary | ICD-10-CM | POA: Diagnosis not present

## 2017-04-15 DIAGNOSIS — I10 Essential (primary) hypertension: Secondary | ICD-10-CM | POA: Diagnosis not present

## 2017-04-20 DIAGNOSIS — F17219 Nicotine dependence, cigarettes, with unspecified nicotine-induced disorders: Secondary | ICD-10-CM | POA: Diagnosis not present

## 2017-06-15 DIAGNOSIS — F411 Generalized anxiety disorder: Secondary | ICD-10-CM | POA: Diagnosis not present

## 2017-06-15 DIAGNOSIS — M5136 Other intervertebral disc degeneration, lumbar region: Secondary | ICD-10-CM | POA: Diagnosis not present

## 2017-06-15 DIAGNOSIS — E7849 Other hyperlipidemia: Secondary | ICD-10-CM | POA: Diagnosis not present

## 2017-06-15 DIAGNOSIS — I10 Essential (primary) hypertension: Secondary | ICD-10-CM | POA: Diagnosis not present

## 2017-10-31 ENCOUNTER — Other Ambulatory Visit: Payer: Self-pay

## 2017-10-31 ENCOUNTER — Emergency Department (HOSPITAL_COMMUNITY)
Admission: EM | Admit: 2017-10-31 | Discharge: 2017-10-31 | Disposition: A | Discharge status: Home / Self Care | Payer: Medicare Other | Attending: Emergency Medicine | Admitting: Emergency Medicine

## 2017-10-31 ENCOUNTER — Emergency Department (HOSPITAL_COMMUNITY): Payer: Medicare Other

## 2017-10-31 ENCOUNTER — Encounter (HOSPITAL_COMMUNITY): Payer: Self-pay

## 2017-10-31 DIAGNOSIS — F17210 Nicotine dependence, cigarettes, uncomplicated: Secondary | ICD-10-CM | POA: Diagnosis not present

## 2017-10-31 DIAGNOSIS — J449 Chronic obstructive pulmonary disease, unspecified: Secondary | ICD-10-CM | POA: Diagnosis not present

## 2017-10-31 DIAGNOSIS — R0602 Shortness of breath: Secondary | ICD-10-CM | POA: Diagnosis not present

## 2017-10-31 DIAGNOSIS — M7989 Other specified soft tissue disorders: Secondary | ICD-10-CM | POA: Diagnosis not present

## 2017-10-31 DIAGNOSIS — I10 Essential (primary) hypertension: Secondary | ICD-10-CM | POA: Insufficient documentation

## 2017-10-31 DIAGNOSIS — R7303 Prediabetes: Secondary | ICD-10-CM | POA: Diagnosis not present

## 2017-10-31 DIAGNOSIS — R2243 Localized swelling, mass and lump, lower limb, bilateral: Secondary | ICD-10-CM | POA: Insufficient documentation

## 2017-10-31 DIAGNOSIS — R0601 Orthopnea: Secondary | ICD-10-CM | POA: Insufficient documentation

## 2017-10-31 DIAGNOSIS — Z79899 Other long term (current) drug therapy: Secondary | ICD-10-CM | POA: Insufficient documentation

## 2017-10-31 LAB — BASIC METABOLIC PANEL
Anion gap: 11 (ref 5–15)
BUN: 10 mg/dL (ref 6–20)
CHLORIDE: 102 mmol/L (ref 98–111)
CO2: 25 mmol/L (ref 22–32)
CREATININE: 0.97 mg/dL (ref 0.61–1.24)
Calcium: 9.1 mg/dL (ref 8.9–10.3)
GFR calc non Af Amer: >60 mL/min (ref >60)
GLUCOSE: 131 mg/dL — AB (ref 70–99)
Potassium: 3.9 mmol/L (ref 3.5–5.1)
Sodium: 138 mmol/L (ref 135–145)

## 2017-10-31 LAB — HEPATIC FUNCTION PANEL
ALK PHOS: 65 U/L (ref 38–126)
ALT: 28 U/L (ref 0–44)
AST: 25 U/L (ref 15–41)
Albumin: 3.6 g/dL (ref 3.5–5.0)
BILIRUBIN DIRECT: 0.1 mg/dL (ref 0.0–0.2)
BILIRUBIN INDIRECT: 0.6 mg/dL (ref 0.3–0.9)
Total Bilirubin: 0.7 mg/dL (ref 0.3–1.2)
Total Protein: 7.2 g/dL (ref 6.5–8.1)

## 2017-10-31 LAB — BRAIN NATRIURETIC PEPTIDE: B Natriuretic Peptide: 33.3 pg/mL (ref 0.0–100.0)

## 2017-10-31 LAB — HEMOGLOBIN A1C
Hgb A1c MFr Bld: 5.8 % — ABNORMAL HIGH (ref 4.8–5.6)
Mean Plasma Glucose: 119.76 mg/dL

## 2017-10-31 LAB — CBC
HCT: 43.8 % (ref 39.0–52.0)
Hemoglobin: 13.7 g/dL (ref 13.0–17.0)
MCH: 30.2 pg (ref 26.0–34.0)
MCHC: 31.3 g/dL (ref 30.0–36.0)
MCV: 96.5 fL (ref 78.0–100.0)
PLATELETS: 215 10*3/uL (ref 150–400)
RBC: 4.54 MIL/uL (ref 4.22–5.81)
RDW: 12.9 % (ref 11.5–15.5)
WBC: 6.9 10*3/uL (ref 4.0–10.5)

## 2017-10-31 LAB — I-STAT TROPONIN, ED: Troponin i, poc: 0 ng/mL (ref 0.00–0.08)

## 2017-10-31 NOTE — ED Provider Notes (Signed)
[facility] EMERGENCY DEPARTMENT Provider Note   CSN: [id number] Arrival date & time: 10/31/17  1130   History   Chief Complaint Chief Complaint  Patient presents with  . Leg Swelling  . Rash    HPI [name] is a 42 y.o. male.  HPI Patient with history of COPD, HTN, cocaine use presents with 3 days hx of new bilateral pedal and hand edema and shortness of breath when laying down.  He takes his medicine inconsistently per his report, he denies any new injury/illness/diet/activity change that would seem to be contributory.  He says he still smokes and has tried to quit but the chantix didn't work.  He also mentions a hip surgery that was "years ago" that has inhibited his ability to work but he says he still walks around a lot and he denies any new changes in his hip.  He takes gabapentin for the pain of his surgeries but says it causes him tremors when trying to sleep so he doesn't like to take it.  He is not asking for pain medication and denies respiratory distress, he's just concerned about this edema.  He also mentions a rash on his chin that has largely resolved by now that is likely coincidental.  Past Medical History:  Diagnosis Date  . Anxiety   . COPD (chronic obstructive pulmonary disease) (HCC)   . Depression   . Hypertension     Patient Active Problem List   Diagnosis Date Noted  . Laceration of right thumb 11/18/2015  . SOB (shortness of breath) 11/03/2013  . COPD (chronic obstructive pulmonary disease) (HCC)   . Hypertension   . Chronic pain syndrome 02/08/2012  . Depression   . Panic attacks 09/16/2006    Past Surgical History:  Procedure Laterality Date  . Femur bone graft  2011   Cadaver-- Dr Mickle [facility]umb [facility]  . HIP ARTHROSCOPY  [id number]   right hip  . JOINT REPLACEMENT  2009   Right THR-- [name]        Home Medications    Prior to Admission medications   Medication Sig Start Date End Date Taking? Authorizing Provider    acetaminophen (TYLENOL) 500 MG tablet Take 500 mg by mouth every 6 (six) hours as needed for moderate pain.    [provider]  albuterol (PROVENTIL HFA;VENTOLIN HFA) 108 (90 BASE) MCG/ACT inhaler Inhale 2 puffs into the lungs every 6 (six) hours as needed for wheezing or shortness of breath. Patient not taking: Reported on 01/04/2017 02/27/14   Tillman Abide I, MD  brompheniramine-pseudoephedrine-DM 30-2-10 MG/5ML syrup Take 5 mLs by mouth 4 (four) times daily as needed. 03/04/17   Joni Reining, PA-C  CHLOROTHIAZIDE PO Take 20 mg by mouth.    [provider]  chlorthalidone (HYGROTON) 25 MG tablet Take 1 tablet (25 mg total) by mouth daily. Patient not taking: Reported on 01/04/2017 03/27/13   Karie Schwalbe, MD  clonazePAM (KLONOPIN) 1 MG tablet TAKE ONE TABLET BY MOUTH TWICE DAILY AS NEEDED FOR SEVERE anxiety 01/01/17   [provider]  EPINEPHrine (EPIPEN) 0.3 mg/0.3 mL SOAJ injection Inject 0.3 mLs (0.3 mg total) into the muscle as needed. 10/16/12   Linwood Dibbles, MD  gabapentin (NEURONTIN) 300 MG capsule Take 300-1,200 mg by mouth 4 (four) times daily. Take 1 capsule (300 mg) TID and Take 4 capsules (1200 mg) at bedtime. 10/16/13   [provider]  ibuprofen (ADVIL,MOTRIN) 600 MG tablet Take 1 tablet (600 mg  total) by mouth every 8 (eight) hours as needed. 03/04/17   Joni Reining, PA-C  ibuprofen (ADVIL,MOTRIN) 800 MG tablet Take 800 mg by mouth 3 (three) times daily as needed for pain.     [provider]  naloxone [facility]) nasal spray 4 mg/0.1 mL Inject into nostril in the event of a narcotic overdose. 01/04/17   Wynetta Fines, MD    Family History Family History  Problem Relation Age of Onset  . Heart disease Father   . Birth defects Other   . Heart disease Other   . Diabetes Other   . Hypertension Other     Social History Social History   Tobacco Use  . Smoking status: Current Every Day Smoker    Packs/day: 1.00    Types:  Cigarettes  . Smokeless tobacco: Never Used  Substance Use Topics  . Alcohol use: No    Frequency: Never  . Drug use: No    Comment: heroin     Allergies   Bee venom; Fluoxetine hcl; and Sertraline hcl   Review of Systems Review of Systems  Constitutional: Positive for fatigue. Negative for appetite change, chills, diaphoresis and fever.  HENT: Negative for congestion, drooling, ear pain, postnasal drip, sinus pressure, sore throat and trouble swallowing.   Eyes: Negative for pain and visual disturbance.  Respiratory: Positive for cough and shortness of breath. Negative for apnea, choking, chest tightness, wheezing and stridor.        SOB is with layign down  Cardiovascular: Positive for leg swelling. Negative for chest pain.  Gastrointestinal: Negative for abdominal distention, constipation, diarrhea, nausea and vomiting.  Endocrine: Negative for polyuria.  Genitourinary: Positive for decreased urine volume. Negative for dysuria and urgency.       Decreased urination  Musculoskeletal: Positive for arthralgias. Negative for myalgias.       Chronic hip arthralgia  Skin: Positive for rash. Negative for wound.       Rash on chin  Neurological: Negative for dizziness, tremors, seizures, syncope, facial asymmetry, speech difficulty, weakness, light-headedness, numbness and headaches.  Psychiatric/Behavioral: Positive for sleep disturbance. Negative for agitation and confusion.     Physical Exam Updated Vital Signs BP 107/72   Pulse (!) 48   Temp 97.8 F (36.6 C) (Oral)   Resp 15   SpO2 99%   Physical Exam  Constitutional: He appears well-developed and well-nourished. No distress.  HENT:  Head: Normocephalic and atraumatic.  Nose: Nose normal.  Mouth/Throat: No oropharyngeal exudate.  Eyes: Right eye exhibits no discharge. Left eye exhibits no discharge.  Neck: Normal range of motion.  Cardiovascular: Normal rate and regular rhythm.  No murmur heard. Pulmonary/Chest:  Effort normal. No stridor. No respiratory distress. He has no wheezes. He exhibits no tenderness.  Crackles bilateral bases  Abdominal: Soft. He exhibits no distension. There is no tenderness.  Musculoskeletal: He exhibits edema. He exhibits no tenderness.  2+ pitting edema to bilateral legs, minimal swelling to hands  Neurological: He is alert. Coordination normal.  Skin: Skin is warm and dry. Capillary refill takes less than 2 seconds. Rash noted. He is not diaphoretic. No erythema.  Mild chin rash  Psychiatric: He has a normal mood and affect. His behavior is normal. Judgment and thought content normal.     ED Treatments / Results  Labs (all labs ordered are listed, but only abnormal results are displayed) Labs Reviewed  BASIC METABOLIC PANEL - Abnormal; Notable for the following components:      Result  Value   Glucose, Bld 131 (*)    All other components within normal limits  HEMOGLOBIN A1C - Abnormal; Notable for the following components:   Hgb A1c MFr Bld 5.8 (*)    All other components within normal limits  CBC  HEPATIC FUNCTION PANEL  BRAIN NATRIURETIC PEPTIDE  I-STAT TROPONIN, ED    EKG EKG Interpretation  Date/Time:  Sunday October 31 2017 13:39:00 EDT Ventricular Rate:  51 PR Interval:    QRS Duration: 93 QT Interval:  454 QTC Calculation: 419 R Axis:   44 Text Interpretation:  Sinus rhythm iincreased  rate compared to prior on 09/28/15 otherwise no significant change Confirmed by Tilden Fossa [id number]) on 10/31/2017 1:48:32 PM Also confirmed by Tilden Fossa [phone]), editor Barbette Hair [phone])  on 10/31/2017 4:25:38 PM   Radiology Dg Chest 2 View  Result Date: 10/31/2017 CLINICAL DATA:  Orthopnea EXAM: CHEST - 2 VIEW COMPARISON:  02/10/2017 FINDINGS: The heart size and mediastinal contours are within normal limits. Both lungs are clear. The visualized skeletal structures are unremarkable. IMPRESSION: No active cardiopulmonary disease. Electronically Signed    By: Deatra Robinson M.D.   On: 10/31/2017 14:01    Procedures Procedures (including critical care time)  Medications Ordered in ED Medications - No data to display   Initial Impression / Assessment and Plan / ED Course  I have reviewed the triage vital signs and the nursing notes.  Pertinent labs & imaging results that were available during my care of the patient were reviewed by me and considered in my medical decision making (see chart for details).   Patient with poor compliance for HTN, surgical hx in hip, cocaine hx and now symptoms concerning for CHF.  Will add liver panel,A1C and BNP to labs.  CXR ordered due to new SOB and crackles on exam.  Patient very stable and comfortable in room, will observe until results are back and then re-evaluate.  Patient with prediabetes (A1C), instructed to follow up with pcp, take meds as prescribed and cease drug use.  Patient understands and agrees with plan to d/c home.    Final Clinical Impressions(s) / ED Diagnoses   Final diagnoses:  Prediabetes  Leg swelling  Orthopnea    ED Discharge Orders    None       Marthenia Rolling, DO 10/31/17 1700    Tilden Fossa, MD 11/04/17 [phone]

## 2017-10-31 NOTE — ED Triage Notes (Signed)
Pt presents for evaluation of 3 day hx of bilateral lower extremity edema. Takes chlorthalidone for BP, states has consistently taken x 4 days, before that did miss some doses. No redness or heat to ankles/feet. Reports rash with clear drainage to chin x 3 days as well.

## 2017-10-31 NOTE — Discharge Instructions (Addendum)
Your labs do not indicate heart failure and your chest xray didn't show any thing that would require admission.  We'd advise you to followup with your primary care doctor this week to discuss your symptoms and be examined again.  We'd also suggest you be more regular with your medications and work on stopping your drug use.  Injections can cause serious and life threatening infections in your heart and other organs.  You were also found to be in "pre-diabetes".  This can be addressed with lifestyle changes or with medication.

## 2017-10-31 NOTE — ED Notes (Signed)
Pt returns from radiology,

## 2017-11-05 ENCOUNTER — Ambulatory Visit (INDEPENDENT_AMBULATORY_CARE_PROVIDER_SITE_OTHER): Payer: Medicare Other | Admitting: Internal Medicine

## 2017-11-05 ENCOUNTER — Encounter: Payer: Self-pay | Admitting: Internal Medicine

## 2017-11-05 VITALS — BP 102/74 | HR 66 | Temp 98.1°F | Ht 69.0 in | Wt 214.0 lb

## 2017-11-05 DIAGNOSIS — Z23 Encounter for immunization: Secondary | ICD-10-CM

## 2017-11-05 DIAGNOSIS — F419 Anxiety disorder, unspecified: Secondary | ICD-10-CM

## 2017-11-05 DIAGNOSIS — R079 Chest pain, unspecified: Secondary | ICD-10-CM | POA: Diagnosis not present

## 2017-11-05 MED ORDER — EPINEPHRINE 0.3 MG/0.3ML IJ SOAJ: 0.3000 mg | INTRAMUSCULAR | 1 refills | Status: DC | PRN

## 2017-11-05 MED ORDER — DULOXETINE HCL 60 MG PO CPEP: 60.0000 mg | ORAL_CAPSULE | Freq: Every day | ORAL | 3 refills | Status: DC

## 2017-11-05 NOTE — Assessment & Plan Note (Addendum)
Tight feeling and SOB is concerning for CAD in this smoker Some FH heart disease Recent sudden edema---that then improved Will set up with cardiology---will need some type of stress test or cath (can't really walk on treadmill) Consider echo as well Could be related to his anxiety---but hard to exclude CAD at this point

## 2017-11-05 NOTE — Progress Notes (Signed)
   Subjective:    Patient ID: [name], male    DOB: 07-04-1975, 42 y.o.   MRN: [id number]  HPI Here for ER follow up  Is dual eligible---- not happy with the provider on his Medicaid card Lives with parents---rents out room  Had gone to ER for severe swelling in feet Mildly in hands Has been on chlorthalidone for HTN--but not voiding much During ER visit--he started diuresing Better now Generally doesn't use salt --and no change in his diet  Concerned about the elevated sugar Cutting out sugared drinks  Has had some tightness in his chest prior ER visit Also noted some in past day or 2 Can be at rest Some SOB lately as well  Current Outpatient Medications on File Prior to Visit  Medication Sig Dispense Refill  . acetaminophen (TYLENOL) 500 MG tablet Take 500 mg by mouth every 6 (six) hours as needed for moderate pain.    Marland Kitchen albuterol (PROVENTIL HFA;VENTOLIN HFA) 108 (90 BASE) MCG/ACT inhaler Inhale 2 puffs into the lungs every 6 (six) hours as needed for wheezing or shortness of breath. 18 g 1  . chlorthalidone (HYGROTON) 25 MG tablet Take 1 tablet (25 mg total) by mouth daily. 90 tablet 3  . EPINEPHrine (EPIPEN) 0.3 mg/0.3 mL SOAJ injection Inject 0.3 mLs (0.3 mg total) into the muscle as needed. 1 Device 1  . gabapentin (NEURONTIN) 300 MG capsule Take 300-1,200 mg by mouth 4 (four) times daily. Take 1 capsule (300 mg) TID and Take 4 capsules (1200 mg) at bedtime.    Marland Kitchen ibuprofen (ADVIL,MOTRIN) 800 MG tablet Take 800 mg by mouth 3 (three) times daily as needed for pain.      No current facility-administered medications on file prior to visit.     Allergies  Allergen Reactions  . Bee Venom Anaphylaxis  . Fluoxetine Hcl     REACTION: paranoia  . Sertraline Hcl     REACTION: didn`t tolerate    Past Medical History:  Diagnosis Date  . Anxiety   . COPD (chronic obstructive pulmonary disease) (HCC)   . Depression   . Hypertension     Past Surgical History:    Procedure Laterality Date  . Femur bone graft  2011   Cadaver-- Dr Mickle Plumb at [facility]  . HIP ARTHROSCOPY  [id number]   right hip  . JOINT REPLACEMENT  2009   Right THR-- [name]    Family History  Problem Relation Age of Onset  . Heart disease Father   . Birth defects Other   . Heart disease Other   . Diabetes Other   . Hypertension Other      Review of Systems No heartburn or swallowing problems Anxiety is worse--has tried various meds but not that helpful (other than valium/klonopin) Past cocaine/pain meds--not now    Objective:   Physical Exam  Constitutional: He appears well-developed. No distress.  Neck: No thyromegaly present.  Cardiovascular: Normal rate, regular rhythm, normal heart sounds and intact distal pulses. Exam reveals no gallop.  No murmur heard. Respiratory: Effort normal and breath sounds normal. No respiratory distress. He has no wheezes. He has no rales.  GI: Soft. There is no tenderness.  Musculoskeletal:  Obvious pain moving right leg and hip  Lymphadenopathy:    He has no cervical adenopathy.  Psychiatric:  Some anxiety           Assessment & Plan:

## 2017-11-05 NOTE — Assessment & Plan Note (Signed)
Along with ongoing pain issues Thinks benzos help---but valid concerns about dependence,etc Will try duloxetine

## 2017-11-05 NOTE — Addendum Note (Signed)
Addended by: Eual Fines on: 11/05/2017 01:11 PM   Modules accepted: Orders

## 2017-12-01 ENCOUNTER — Encounter (INDEPENDENT_AMBULATORY_CARE_PROVIDER_SITE_OTHER): Payer: Self-pay

## 2017-12-01 ENCOUNTER — Encounter: Payer: Self-pay | Admitting: *Deleted

## 2017-12-01 ENCOUNTER — Ambulatory Visit (INDEPENDENT_AMBULATORY_CARE_PROVIDER_SITE_OTHER): Payer: Medicare Other | Admitting: Cardiology

## 2017-12-01 ENCOUNTER — Encounter: Payer: Self-pay | Admitting: Cardiology

## 2017-12-01 VITALS — BP 116/76 | HR 74 | Ht 69.0 in | Wt 217.4 lb

## 2017-12-01 DIAGNOSIS — E782 Mixed hyperlipidemia: Secondary | ICD-10-CM

## 2017-12-01 DIAGNOSIS — R0602 Shortness of breath: Secondary | ICD-10-CM

## 2017-12-01 DIAGNOSIS — Z8249 Family history of ischemic heart disease and other diseases of the circulatory system: Secondary | ICD-10-CM

## 2017-12-01 DIAGNOSIS — R0789 Other chest pain: Secondary | ICD-10-CM

## 2017-12-01 DIAGNOSIS — F17200 Nicotine dependence, unspecified, uncomplicated: Secondary | ICD-10-CM

## 2017-12-01 NOTE — Patient Instructions (Signed)
Your physician recommends that you continue on your current medications as directed. Please refer to the Current Medication list given to you today. Your physician recommends that you schedule a follow-up appointment in:  AS NEEDED  Your physician has requested that you have an echocardiogram. Echocardiography is a painless test that uses sound waves to create images of your heart. It provides your doctor with information about the size and shape of your heart and how well your heart's chambers and valves are working. This procedure takes approximately one hour. There are no restrictions for this procedure.    Your physician has requested that you have en exercise stress myoview. For further information please visit [email or web address]. Please follow instruction sheet, as given.  

## 2017-12-01 NOTE — Progress Notes (Signed)
Cardiology Office Note:    Date:  12/01/2017   ID:  [name], DOB [birth year], MRN [id number]  PCP:  Karie Schwalbe, MD  Cardiologist:  No primary care provider on file.  Electrophysiologist:  None   Referring MD: Karie Schwalbe, MD    History of Present Illness:    [name] is a 42 y.o. male here for evaluation of chest pain at the request of Dr. Alphonsus Sias.  In review of Dr. Karle Starch note on 11/05/2017 after an emergency department visit on 10/31/2017 he was there for follow-up and had gone to the ER for severe swelling in his feet and somewhat in his hands as well.  Had been on chlorthalidone for hypertension but it noted that he had not had any increase in voiding.  He was concerned about his elevated blood sugars.  He also stated that he had some chest tightness prior to his ER visit and noted some occasionally throughout the days.  Mostly at rest.  He has had some shortness of breath as well.  The edema seem to come and go.  He does remember that he may have had a high salt load prior to this.  He continues to smoke.  With his tight feeling in his chest as well as shortness of breath and family history of heart disease there was concern so he was sent here for further evaluation.  We are going to consider echo as well as stress test.  There was some thought that he really could not walk on the treadmill.  He was also concerned that this may be anxiety related but it was hard to exclude CAD.  Father has CAD, valve.  Disease process started in his 92's.  Past Medical History:  Diagnosis Date  . Anxiety   . COPD (chronic obstructive pulmonary disease) (HCC)   . Depression   . Hypertension     Past Surgical History:  Procedure Laterality Date  . Femur bone graft  2011   Cadaver-- [facility] Mickle [facility]umb [facility]  . HIP ARTHROSCOPY  [id number]   right hip  . JOINT REPLACEMENT  2009   Right THR-- [name]    Current Medications: Current Meds  Medication Sig  . acetaminophen  (TYLENOL) 500 MG tablet Take 500 mg by mouth every 6 (six) hours as needed for moderate pain.  Marland Kitchen albuterol (PROVENTIL HFA;VENTOLIN HFA) 108 (90 BASE) MCG/ACT inhaler Inhale 2 puffs into the lungs every 6 (six) hours as needed for wheezing or shortness of breath.  . chlorthalidone (HYGROTON) 25 MG tablet Take 1 tablet (25 mg total) by mouth daily.  Marland Kitchen EPINEPHrine (EPIPEN) 0.3 mg/0.3 mL IJ SOAJ injection Inject 0.3 mLs (0.3 mg total) into the muscle as needed.  . gabapentin (NEURONTIN) 300 MG capsule Take 300-1,200 mg by mouth 4 (four) times daily. Take 1 capsule (300 mg) TID and Take 4 capsules (1200 mg) at bedtime.  Marland Kitchen ibuprofen (ADVIL,MOTRIN) 800 MG tablet Take 800 mg by mouth 3 (three) times daily as needed for pain.      Allergies:   Bee venom; Fluoxetine hcl; and Sertraline hcl   Social History   Socioeconomic History  . Marital status: Divorced    Spouse name: Not on file  . Number of children: 2  . Years of education: Not on file  . Highest education level: Not on file  Occupational History  . Occupation: Disabled since 2012    Comment: from hip  Social Needs  . Financial resource strain:  Not on file  . Food insecurity:    Worry: Not on file    Inability: Not on file  . Transportation needs:    Medical: Not on file    Non-medical: Not on file  Tobacco Use  . Smoking status: Current Every Day Smoker    Packs/day: 1.00    Types: Cigarettes  . Smokeless tobacco: Never Used  Substance and Sexual Activity  . Alcohol use: No    Frequency: Never  . 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 Sexual activity: Not on file  Lifestyle  . Physical activity:    Days per week: Not on file    Minutes per session: Not on file  . Stress: Not on file  Relationships  . Social connections:    Talks on phone: Not on file    Gets together: Not on file    Attends religious service: Not on file    Active member of club or organization: Not on file    Attends meetings of clubs or organizations:  Not on file    Relationship status: Not on file  Other Topics Concern  . Not on file  Social History Narrative   Daughter and son from different relationships     Family History: The patient's family history includes Birth defects in his other; Diabetes in his other; Heart disease in his father and other; Hypertension in his other.  ROS:   Please see the history of present illness.    He has had appetite changes chills waking up at night short of breath shortness of breath with activity depression back pain muscle pain fatigue sweating chest pain leg pain snoring anxiety balance issues headaches.  All other systems reviewed and are negative.  EKGs/Labs/Other Studies Reviewed:    The following studies were reviewed today: Prior office notes, ED visit, lab work, EKG  EKG:  EKG is not ordered today.  The ekg ordered today demonstrates 10/31/2017 shows sinus bradycardia 51 with nonspecific ST-T wave changes, personally reviewed and interpreted.  Recent Labs: 10/31/2017: ALT 28; B Natriuretic Peptide 33.3; BUN 10; Creatinine, Ser 0.97; Hemoglobin 13.7; Platelets 215; Potassium 3.9; Sodium 138  Recent Lipid Panel    Component Value Date/Time   CHOL 258 (H) 11/07/2013 0941   TRIG 266.0 (H) 11/07/2013 0941   HDL 34.60 (L) 11/07/2013 0941   CHOLHDL 7 11/07/2013 0941   VLDL 53.2 (H) 11/07/2013 0941   LDLCALC 157 (H) 07/17/2009 2000   LDLDIRECT 189.7 11/07/2013 0941   Once again-BNP was normal at 33.  Chest x-ray showed no cardiopulmonary disease. LDL calculated was 157.  Hemoglobin A1c 5.8.  Physical Exam:    VS:  BP 116/76   Pulse 74   Ht 5\' 9"  (1.753 m)   Wt 217 lb 6.4 oz (98.6 kg)   SpO2 96%   BMI 32.10 kg/m     Wt Readings from Last 3 Encounters:  12/01/17 217 lb 6.4 oz (98.6 kg)  11/05/17 214 lb (97.1 kg)  03/04/17 200 lb (90.7 kg)     GEN: Overweight Well nourished, well developed in no acute distress HEENT: Normal NECK: No JVD; No carotid bruits LYMPHATICS: No  lymphadenopathy CARDIAC: RRR, no murmurs, rubs, gallops RESPIRATORY:  Clear to auscultation without rales, wheezing or rhonchi  ABDOMEN: Soft, non-tender, non-distended MUSCULOSKELETAL:  No edema; No deformity  SKIN: Warm and dry NEUROLOGIC:  Alert and oriented x 3 PSYCHIATRIC:  Normal affect   ASSESSMENT:    1. SOB (shortness  of breath)   2. Other chest pain   3. Family history of early CAD   4. Smoker   5. Mixed hyperlipidemia    PLAN:    In order of problems listed above:  Chest pain/shortness of breath/transient edema/essential hypertension/tobacco use -We will go ahead and set him up for a nuclear stress test.  If he can walk the treadmill that we great, if not, Lexiscan. -We will also check an echocardiogram to ensure proper structure and function of his heart. - Ultimately we discussed lifestyle modifications, decreasing salt intake which can help with his edema as well as blood pressure, decreasing weight and ultimately trying to work on tobacco cessation.  He has tried Chantix in the past but this caused more anxiety.  Also, increased use of ibuprofen can lead to increasing edema as well.  Mixed hyperlipidemia -Certainly agree with  weight loss, diet, exercise.  Agree with reduction in/cessation and sugar drinks.   We will follow-up with results of testing.    Medication Adjustments/Labs and Tests Ordered: Current medicines are reviewed at length with the patient today.  Concerns regarding medicines are outlined above.  Orders Placed This Encounter  Procedures  . Myocardial Perfusion Imaging  . ECHOCARDIOGRAM COMPLETE   No orders of the defined types were placed in this encounter.   Patient Instructions  Your physician recommends that you continue on your current medications as directed. Please refer to the Current Medication list given to you today.   Your physician recommends that you schedule a follow-up appointment in:  AS NEEDED   Your physician has  requested that you have an echocardiogram. Echocardiography is a painless test that uses sound waves to create images of your heart. It provides your doctor with information about the size and shape of your heart and how well your heart's chambers and valves are working. This procedure takes approximately one hour. There are no restrictions for this procedure.    Your physician has requested that you have en exercise stress myoview. For further information please visit [email or web address] Please follow instruction sheet, as given.     Signed, Donato Schultz, MD  12/01/2017 8:58 AM     [facility]

## 2017-12-08 ENCOUNTER — Ambulatory Visit (HOSPITAL_COMMUNITY): Payer: Medicare Other | Attending: Cardiology

## 2017-12-08 ENCOUNTER — Other Ambulatory Visit: Payer: Self-pay

## 2017-12-08 DIAGNOSIS — R0789 Other chest pain: Secondary | ICD-10-CM | POA: Diagnosis not present

## 2017-12-08 DIAGNOSIS — Z8249 Family history of ischemic heart disease and other diseases of the circulatory system: Secondary | ICD-10-CM | POA: Insufficient documentation

## 2017-12-08 DIAGNOSIS — F17200 Nicotine dependence, unspecified, uncomplicated: Secondary | ICD-10-CM | POA: Insufficient documentation

## 2017-12-08 DIAGNOSIS — R0602 Shortness of breath: Secondary | ICD-10-CM | POA: Diagnosis not present

## 2017-12-09 ENCOUNTER — Telehealth (HOSPITAL_COMMUNITY): Payer: Self-pay | Admitting: *Deleted

## 2017-12-09 ENCOUNTER — Encounter: Payer: Self-pay | Admitting: Internal Medicine

## 2017-12-09 ENCOUNTER — Ambulatory Visit (INDEPENDENT_AMBULATORY_CARE_PROVIDER_SITE_OTHER): Payer: Medicare Other | Admitting: Internal Medicine

## 2017-12-09 VITALS — BP 108/60 | HR 59 | Temp 97.6°F | Ht 69.0 in | Wt 211.0 lb

## 2017-12-09 DIAGNOSIS — R079 Chest pain, unspecified: Secondary | ICD-10-CM | POA: Diagnosis not present

## 2017-12-09 MED ORDER — OMEPRAZOLE 20 MG PO CPDR: 20.0000 mg | DELAYED_RELEASE_CAPSULE | Freq: Every day | ORAL | 3 refills | Status: DC

## 2017-12-09 NOTE — Assessment & Plan Note (Signed)
Still occurs but no clear pattern Echo looked fine Has nuclear stress next week He doesn't have heartburn but does have some throat symptoms Will try empiric omeprazole

## 2017-12-09 NOTE — Progress Notes (Signed)
Subjective:    Patient ID: [name], male    DOB: 1975-08-30, 42 y.o.   MRN: [id number]  HPI Here for follow up of chest pain  Still gets some chest pain-- but not in past few days Not always with exertion --most he does is walking and some yard work No regular cough Feels short winded at times--but not too bad  Still smoking --but has cut back Now only 1/2 PPD  No heartburn  No regurgitation but does get some tightness when swallowing  Current Outpatient Medications on File Prior to Visit  Medication Sig Dispense Refill  . acetaminophen (TYLENOL) 500 MG tablet Take 500 mg by mouth every 6 (six) hours as needed for moderate pain.    Marland Kitchen albuterol (PROVENTIL HFA;VENTOLIN HFA) 108 (90 BASE) MCG/ACT inhaler Inhale 2 puffs into the lungs every 6 (six) hours as needed for wheezing or shortness of breath. 18 g 1  . chlorthalidone (HYGROTON) 25 MG tablet Take 1 tablet (25 mg total) by mouth daily. 90 tablet 3  . EPINEPHrine (EPIPEN) 0.3 mg/0.3 mL IJ SOAJ injection Inject 0.3 mLs (0.3 mg total) into the muscle as needed. 1 Device 1  . gabapentin (NEURONTIN) 300 MG capsule Take 300-1,200 mg by mouth 4 (four) times daily. Take 1 capsule (300 mg) TID and Take 4 capsules (1200 mg) at bedtime.    Marland Kitchen ibuprofen (ADVIL,MOTRIN) 800 MG tablet Take 800 mg by mouth 3 (three) times daily as needed for pain.      No current facility-administered medications on file prior to visit.     Allergies  Allergen Reactions  . Bee Venom Anaphylaxis  . Fluoxetine Hcl     REACTION: paranoia  . Sertraline Hcl     REACTION: didn`t tolerate    Past Medical History:  Diagnosis Date  . Anxiety   . COPD (chronic obstructive pulmonary disease) (HCC)   . Depression   . Hypertension     Past Surgical History:  Procedure Laterality Date  . Femur bone graft  2011   [place]-- Dr Mickle Plumb at [facility]  . HIP ARTHROSCOPY  [id number]   right hip  . JOINT REPLACEMENT  2009   Right THR-- [name]    Family  History  Problem Relation Age of Onset  . Heart disease Father   . Birth defects Other   . Heart disease Other   . Diabetes Other   . Hypertension Other     Social History   Socioeconomic History  . Marital status: Divorced    Spouse name: Not on file  . Number of children: 2  . Years of education: Not on file  . Highest education level: Not on file  Occupational History  . Occupation: Disabled since 2012    Comment: from hip  Social Needs  . Financial resource strain: Not on file  . Food insecurity:    Worry: Not on file    Inability: Not on file  . Transportation needs:    Medical: Not on file    Non-medical: Not on file  Tobacco Use  . Smoking status: Current Every Day Smoker    Packs/day: 1.00    Types: Cigarettes  . Smokeless tobacco: Never Used  Substance and Sexual Activity  . Alcohol use: No    Frequency: Never  . 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 Sexual activity: Not on file  Lifestyle  . Physical activity:    Days per week: Not on file  Minutes per session: Not on file  . Stress: Not on file  Relationships  . Social connections:    Talks on phone: Not on file    Gets together: Not on file    Attends religious service: Not on file    Active member of club or organization: Not on file    Attends meetings of clubs or organizations: Not on file    Relationship status: Not on file  . Intimate partner violence:    Fear of current or ex partner: Not on file    Emotionally abused: Not on file    Physically abused: Not on file    Forced sexual activity: Not on file  Other Topics Concern  . Not on file  Social History Narrative   Daughter and son from different relationships   Review of Systems  Not sleeping well--nothing new Bothered by pain, just can't get comfortable and has to move around, etc Gets tired in the day       Objective:   Physical Exam  Constitutional: He appears well-developed. No distress.  Neck: No thyromegaly present.    Cardiovascular: Normal rate and regular rhythm. Exam reveals no gallop.  No murmur heard. Respiratory: Effort normal and breath sounds normal. No respiratory distress. He has no wheezes. He has no rales.  GI: Soft. He exhibits no distension. There is no tenderness. There is no rebound and no guarding.  Lymphadenopathy:    He has no cervical adenopathy.           Assessment & Plan:

## 2017-12-09 NOTE — Telephone Encounter (Signed)
Patient given detailed instructions per Myocardial Perfusion Study Information Sheet for the test on 12/14/17. Patient notified to arrive 15 minutes early and that it is imperative to arrive on time for appointment to keep from having the test rescheduled.  If you need to cancel or reschedule your appointment, please call the office within 24 hours of your appointment. . Patient verbalized understanding. Saphyra Hutt Jacqueline    

## 2017-12-13 ENCOUNTER — Telehealth: Payer: Self-pay | Admitting: *Deleted

## 2017-12-13 DIAGNOSIS — I77810 Thoracic aortic ectasia: Secondary | ICD-10-CM

## 2017-12-13 DIAGNOSIS — Z01812 Encounter for preprocedural laboratory examination: Secondary | ICD-10-CM

## 2017-12-13 NOTE — Telephone Encounter (Signed)
Spoke with patient who is aware of the results below.  He is aware he will be contacted to be scheduled for CT of aorta.  Notes recorded by Jake Bathe, MD on 12/10/2017 at 4:38 PM EST Dilated aorta. 44mm. Normal EF.  Order CT of aorta to evaluate dilated aortic root.  Donato Schultz, MD

## 2017-12-14 ENCOUNTER — Encounter (HOSPITAL_COMMUNITY): Payer: Medicare Other

## 2017-12-21 ENCOUNTER — Telehealth (HOSPITAL_COMMUNITY): Payer: Self-pay

## 2017-12-21 NOTE — Telephone Encounter (Signed)
Detailed message left for the pt, asked to call back with any questions. S.Demareon Coldwell EMTP

## 2017-12-22 ENCOUNTER — Telehealth (HOSPITAL_COMMUNITY): Payer: Self-pay

## 2017-12-22 NOTE — Telephone Encounter (Signed)
Pt called in with a question about his stress test. Pt stated that he would be there. S.Domonik Levario  EMTP

## 2017-12-23 ENCOUNTER — Ambulatory Visit (HOSPITAL_COMMUNITY): Payer: Medicare Other

## 2017-12-23 ENCOUNTER — Encounter (HOSPITAL_COMMUNITY): Payer: Self-pay | Admitting: *Deleted

## 2017-12-27 ENCOUNTER — Ambulatory Visit
Admission: RE | Admit: 2017-12-27 | Discharge: 2017-12-27 | Disposition: A | Discharge status: Home / Self Care | Payer: Medicare Other | Source: Ambulatory Visit | Attending: Cardiology | Admitting: Cardiology

## 2017-12-27 ENCOUNTER — Ambulatory Visit (HOSPITAL_COMMUNITY): Payer: Medicare Other

## 2017-12-27 DIAGNOSIS — Z01812 Encounter for preprocedural laboratory examination: Secondary | ICD-10-CM

## 2017-12-27 DIAGNOSIS — I77810 Thoracic aortic ectasia: Secondary | ICD-10-CM

## 2017-12-28 ENCOUNTER — Inpatient Hospital Stay: Admission: RE | Admit: 2017-12-28 | Payer: Medicare Other | Source: Ambulatory Visit

## 2017-12-28 ENCOUNTER — Telehealth: Payer: Self-pay | Admitting: *Deleted

## 2017-12-28 DIAGNOSIS — I712 Thoracic aortic aneurysm, without rupture, unspecified: Secondary | ICD-10-CM

## 2017-12-28 DIAGNOSIS — Z8249 Family history of ischemic heart disease and other diseases of the circulatory system: Secondary | ICD-10-CM

## 2017-12-28 DIAGNOSIS — R0789 Other chest pain: Secondary | ICD-10-CM

## 2017-12-28 DIAGNOSIS — I77810 Thoracic aortic ectasia: Secondary | ICD-10-CM

## 2017-12-28 NOTE — Telephone Encounter (Signed)
Received notification that nuc department here was unable to obtain IV access in the office and pt needs to be rescheduled in the hospital.  Was instructed new orders need to be placed so that patient can be scheduled.

## 2018-03-14 ENCOUNTER — Ambulatory Visit: Payer: Medicare Other | Admitting: Internal Medicine

## 2018-04-04 ENCOUNTER — Ambulatory Visit (INDEPENDENT_AMBULATORY_CARE_PROVIDER_SITE_OTHER): Payer: Medicare Other | Admitting: Family Medicine

## 2018-04-04 ENCOUNTER — Encounter: Payer: Self-pay | Admitting: Family Medicine

## 2018-04-04 VITALS — BP 140/92 | HR 79 | Temp 97.6°F | Resp 18 | Ht 69.0 in | Wt 225.8 lb

## 2018-04-04 DIAGNOSIS — I77810 Thoracic aortic ectasia: Secondary | ICD-10-CM | POA: Diagnosis not present

## 2018-04-04 DIAGNOSIS — L03311 Cellulitis of abdominal wall: Secondary | ICD-10-CM

## 2018-04-04 DIAGNOSIS — M545 Low back pain, unspecified: Secondary | ICD-10-CM

## 2018-04-04 DIAGNOSIS — R21 Rash and other nonspecific skin eruption: Secondary | ICD-10-CM | POA: Diagnosis not present

## 2018-04-04 MED ORDER — DOXYCYCLINE HYCLATE 100 MG PO CAPS: 100.0000 mg | ORAL_CAPSULE | Freq: Two times a day (BID) | ORAL | 0 refills | Status: DC

## 2018-04-04 MED ORDER — CYCLOBENZAPRINE HCL 10 MG PO TABS: 10.0000 mg | ORAL_TABLET | Freq: Two times a day (BID) | ORAL | 0 refills | Status: DC | PRN

## 2018-04-04 MED ORDER — MELOXICAM 15 MG PO TABS: 15.0000 mg | ORAL_TABLET | Freq: Every day | ORAL | 0 refills | Status: DC

## 2018-04-04 NOTE — Progress Notes (Signed)
Subjective:    Patient ID: [name], male    DOB: 06/03/1975, 43 y.o.   MRN: [id number]  HPI This is a 43 yo male, accompanied by his mother, who presents today with back pain x 1 week and all over body rash x several days . Back pain across low back, muscle feels tight. Was riding a 4 wheeler the night before pain started. Entire body aches. No numbness or tingling. Feels weak all over. Took ibuprofen 4 alternating with tylenol 2. Difficulty sleeping, can't get comfortable. Feet swelling, this is a chronic intermittent problem.  Rash started a couple of days after getting out of jail. One of his cell mates had MRSA.  He did not have his medication while in jail.  He was found to have dilated aortic root during Echo. He went for CT angio of chest but they were unable to get IV access and his test was to be scheduled at the hospital but, according to the patient, he was never called.  Mother inquiring about sleep study that was supposed to be done. I am unable to find any record of this. They were advised that they will need to make an appointment with Dr Alphonsus Sias.   Past Medical History:  Diagnosis Date  . Anxiety   . COPD (chronic obstructive pulmonary disease) (HCC)   . Depression   . Hypertension    Past Surgical History:  Procedure Laterality Date  . Femur bone graft  2011   [place]-- Dr Mickle Plumb at [facility]  . HIP ARTHROSCOPY  [id number]   right hip  . JOINT REPLACEMENT  2009   Right THR-- [name]   Family History  Problem Relation Age of Onset  . Heart disease Father   . Birth defects Other   . Heart disease Other   . Diabetes Other   . Hypertension Other    Social History   Tobacco Use  . Smoking status: Current Every Day Smoker    Packs/day: 1.00    Types: Cigarettes  . Smokeless tobacco: Never Used  Substance Use Topics  . Alcohol use: No    Frequency: Never  . Drug use: No    Comment: heroin     Review of Systems     Objective:   Physical  Exam Constitutional:      General: He is not in acute distress.    Appearance: Normal appearance. He is obese. He is diaphoretic. He is not toxic-appearing.  HENT:     Head: Normocephalic and atraumatic.     Nose: Nose normal.     Mouth/Throat:     Mouth: Mucous membranes are moist.     Pharynx: Oropharynx is clear.  Eyes:     Extraocular Movements: Extraocular movements intact.     Conjunctiva/sclera: Conjunctivae normal.  Neck:     Musculoskeletal: Normal range of motion and neck supple. No neck rigidity or muscular tenderness.  Cardiovascular:     Rate and Rhythm: Normal rate and regular rhythm.     Heart sounds: Normal heart sounds.  Pulmonary:     Effort: Pulmonary effort is normal.     Breath sounds: Normal breath sounds.  Musculoskeletal:     Lumbar back: He exhibits tenderness (generalized bilateral). He exhibits normal range of motion, no bony tenderness, no swelling, no edema and no deformity.     Right lower leg: Edema present.     Left lower leg: Edema present.     Comments: Negative straight leg raise  Lymphadenopathy:     Cervical: No cervical adenopathy.  Skin:    General: Skin is warm.     Comments: Scattered scabbed lesions- abdomen, arms, face. Erythematous macules with scab. Abdominal lesion with surrounding erythema. In total, approximately 8-10 lesions.   Neurological:     Mental Status: He is alert and oriented to person, place, and time.  Psychiatric:        Mood and Affect: Mood normal.        Behavior: Behavior normal.        Thought Content: Thought content normal.        Judgment: Judgment normal.       BP (!) 140/92   Pulse 79   Temp 97.6 F (36.4 C) (Oral)   Resp 18   Ht 5\' 9"  (1.753 m)   Wt 225 lb 12.8 oz (102.4 kg)   SpO2 94%   BMI 33.34 kg/m      Assessment & Plan:  1. Acute bilateral low back pain without sciatica - Provided written and verbal information regarding diagnosis and treatment. - RTC precautions reveiwed -  meloxicam (MOBIC) 15 MG tablet; Take 1 tablet (15 mg total) by mouth daily.  Dispense: 20 tablet; Refill: 0 - cyclobenzaprine (FLEXERIL) 10 MG tablet; Take 1 tablet (10 mg total) by mouth every 12 (twelve) hours as needed for muscle spasms.  Dispense: 20 tablet; Refill: 0  2. Cellulitis of abdominal wall - concern with recent incarceration and possible MRSA exposure - doxycycline (VIBRAMYCIN) 100 MG capsule; Take 1 capsule (100 mg total) by mouth 2 (two) times daily.  Dispense: 14 capsule; Refill: 0  3. Rash - no new appearing lesions, RTC precautions reviewed  4. Dilated aortic root (HCC) - provided him with number to contact cardiology about scheduling additional imaging   Olean Ree, FNP-BC  [facility], [facility]  04/04/2018 2:09 PM

## 2018-04-04 NOTE — Patient Instructions (Addendum)
Good to see you today  I think you have a back strain. I have sent in a once a day anti-inflammatory. Do not take additional ibuprofen, Alleve, Advil. You can take Tylenol every 8 hours  Do gentle stretching several times a day and use heat as needed  I have sent in an antibiotic for skin infection. Cut your nails short to avoid scratching skin. Use a mild soap. If not better after antibiotic, please let us knoe  Please call cardiology about setting up your CT of your aorta- this is very important!! [phone]  I do not see order for sleep study, not sure who was going to order, please follow up with Dr. Alphonsus Sias- you are overdue and discuss with him   Back Exercises If you have pain in your back, do these exercises 2-3 times each day or as told by your doctor. When the pain goes away, do the exercises once each day, but repeat the steps more times for each exercise (do more repetitions). If you do not have pain in your back, do these exercises once each day or as told by your doctor. Exercises Single Knee to Chest Do these steps 3-5 times in a row for each leg: 1. Lie on your back on a firm bed or the floor with your legs stretched out. 2. Bring one knee to your chest. 3. Hold your knee to your chest by grabbing your knee or thigh. 4. Pull on your knee until you feel a gentle stretch in your lower back. 5. Keep doing the stretch for 10-30 seconds. 6. Slowly let go of your leg and straighten it. Pelvic Tilt Do these steps 5-10 times in a row: 1. Lie on your back on a firm bed or the floor with your legs stretched out. 2. Bend your knees so they point up to the ceiling. Your feet should be flat on the floor. 3. Tighten your lower belly (abdomen) muscles to press your lower back against the floor. This will make your tailbone point up to the ceiling instead of pointing down to your feet or the floor. 4. Stay in this position for 5-10 seconds while you gently tighten your muscles and  breathe evenly. Cat-Cow Do these steps until your lower back bends more easily: 1. Get on your hands and knees on a firm surface. Keep your hands under your shoulders, and keep your knees under your hips. You may put padding under your knees. 2. Let your head hang down, and make your tailbone point down to the floor so your lower back is round like the back of a cat. 3. Stay in this position for 5 seconds. 4. Slowly lift your head and make your tailbone point up to the ceiling so your back hangs low (sags) like the back of a cow. 5. Stay in this position for 5 seconds.  Press-Ups Do these steps 5-10 times in a row: 1. Lie on your belly (face-down) on the floor. 2. Place your hands near your head, about shoulder-width apart. 3. While you keep your back relaxed and keep your hips on the floor, slowly straighten your arms to raise the top half of your body and lift your shoulders. Do not use your back muscles. To make yourself more comfortable, you may change where you place your hands. 4. Stay in this position for 5 seconds. 5. Slowly return to lying flat on the floor.  Bridges Do these steps 10 times in a row: 1. Lie on your back  on a firm surface. 2. Bend your knees so they point up to the ceiling. Your feet should be flat on the floor. 3. Tighten your butt muscles and lift your butt off of the floor until your waist is almost as high as your knees. If you do not feel the muscles working in your butt and the back of your thighs, slide your feet 1-2 inches farther away from your butt. 4. Stay in this position for 3-5 seconds. 5. Slowly lower your butt to the floor, and let your butt muscles relax. If this exercise is too easy, try doing it with your arms crossed over your chest. Belly Crunches Do these steps 5-10 times in a row: 1. Lie on your back on a firm bed or the floor with your legs stretched out. 2. Bend your knees so they point up to the ceiling. Your feet should be flat on the  floor. 3. Cross your arms over your chest. 4. Tip your chin a little bit toward your chest but do not bend your neck. 5. Tighten your belly muscles and slowly raise your chest just enough to lift your shoulder blades a tiny bit off of the floor. 6. Slowly lower your chest and your head to the floor. Back Lifts Do these steps 5-10 times in a row: 1. Lie on your belly (face-down) with your arms at your sides, and rest your forehead on the floor. 2. Tighten the muscles in your legs and your butt. 3. Slowly lift your chest off of the floor while you keep your hips on the floor. Keep the back of your head in line with the curve in your back. Look at the floor while you do this. 4. Stay in this position for 3-5 seconds. 5. Slowly lower your chest and your face to the floor. Contact a doctor if:  Your back pain gets a lot worse when you do an exercise.  Your back pain does not lessen 2 hours after you exercise. If you have any of these problems, stop doing the exercises. Do not do them again unless your doctor says it is okay. Get help right away if:  You have sudden, very bad back pain. If this happens, stop doing the exercises. Do not do them again unless your doctor says it is okay. This information is not intended to replace advice given to you by your health care provider. Make sure you discuss any questions you have with your health care provider. Document Released: 02/21/2010 Document Revised: 10/13/2017 Document Reviewed: 03/15/2014 Elsevier Interactive Patient Education  [employer].

## 2018-04-08 ENCOUNTER — Ambulatory Visit (HOSPITAL_COMMUNITY)
Admission: RE | Admit: 2018-04-08 | Discharge: 2018-04-08 | Disposition: A | Discharge status: Home / Self Care | Payer: Medicare Other | Source: Ambulatory Visit | Attending: Cardiology | Admitting: Cardiology

## 2018-04-08 DIAGNOSIS — I77810 Thoracic aortic ectasia: Secondary | ICD-10-CM | POA: Diagnosis not present

## 2018-04-08 DIAGNOSIS — I712 Thoracic aortic aneurysm, without rupture, unspecified: Secondary | ICD-10-CM

## 2018-04-08 MED ORDER — IOHEXOL 350 MG/ML SOLN
75.0000 mL | Freq: Once | INTRAVENOUS | Status: AC | PRN
  Administered 2018-04-08: 75 mL via INTRAVENOUS

## 2018-04-12 ENCOUNTER — Telehealth: Payer: Self-pay | Admitting: *Deleted

## 2018-04-12 NOTE — Telephone Encounter (Signed)
Result Notes for CT ANGIO CHEST AORTA W &/OR WO CONTRAST   Notes recorded by Jake Bathe, MD on 04/11/2018 at 12:58 PM EDT Aortic dilation, 4.7cm max. Let's refer to [facility] to continue with monitoring.  Donato Schultz, MD

## 2018-04-12 NOTE — Telephone Encounter (Signed)
Left message to call back for results

## 2018-04-14 ENCOUNTER — Other Ambulatory Visit: Payer: Self-pay | Admitting: *Deleted

## 2018-04-14 DIAGNOSIS — I712 Thoracic aortic aneurysm, without rupture, unspecified: Secondary | ICD-10-CM

## 2018-04-14 NOTE — Telephone Encounter (Signed)
Pt aware of results and referral to [facility].  Aware someone will be calling to schedule appt to establish.

## 2018-05-04 ENCOUNTER — Encounter: Payer: Medicare Other | Admitting: Cardiothoracic Surgery

## 2018-05-12 DIAGNOSIS — Z79891 Long term (current) use of opiate analgesic: Secondary | ICD-10-CM | POA: Diagnosis not present

## 2018-05-12 DIAGNOSIS — Z5181 Encounter for therapeutic drug level monitoring: Secondary | ICD-10-CM | POA: Diagnosis not present

## 2018-05-18 ENCOUNTER — Other Ambulatory Visit: Payer: Self-pay | Admitting: Internal Medicine

## 2018-05-31 ENCOUNTER — Other Ambulatory Visit: Payer: Self-pay

## 2018-06-01 ENCOUNTER — Institutional Professional Consult (permissible substitution) (INDEPENDENT_AMBULATORY_CARE_PROVIDER_SITE_OTHER): Payer: Medicare Other | Admitting: Cardiothoracic Surgery

## 2018-06-01 ENCOUNTER — Encounter: Payer: Self-pay | Admitting: Cardiothoracic Surgery

## 2018-06-01 VITALS — BP 124/68 | HR 68 | Temp 98.1°F | Resp 16 | Ht 69.0 in | Wt 215.0 lb

## 2018-06-01 DIAGNOSIS — I712 Thoracic aortic aneurysm, without rupture, unspecified: Secondary | ICD-10-CM

## 2018-06-01 NOTE — Progress Notes (Signed)
PCP is Karie Schwalbe, MD Referring Provider is Jake Bathe, MD  Chief Complaint  Patient presents with  . Thoracic Aortic Aneurysm    per CTA CHEST 04/10/18, ECHO 12/08/17   Patient examined, images of recent CTA of chest and 2D echocardiogram personally reviewed and counseled with patient.   HPI: 43 year old hypertensive nondiabetic male smoker presents for evaluation of a 4.6 cm fusiform ascending aortic aneurysm.  This was found as an incidental finding when he  was evaluated by Dr. Anne Fu for atypical chest pain.  The echocardiogram shows normal LV function, normal valvular function and a 4.4 cm diameter of the proximal ascending aorta.  The patient then underwent a CTA to evaluate the entire thoracic aorta.  At the level above the sinotubular junction the aortic diameter measures 4.6 cm.  There is minimal calcification.  There is no evidence of hematoma or penetrating ulceration.  The aortic wall is smooth.  The patient takes chlorthalidone for mild hypertension.  Blood pressure today is normal.  The patient smokes about 1/2 pack/day.  He is trying to stop.  There is no family history of aortic dissection or ruptured abdominal aneurysm.  There are no previous chest CT scans with which to compare.  Previous chest x-rays in our system show no significant abnormality.  Patient has significant musculoskeletal pain in his back and has had a right hip replacement for degenerative arthritis.  He also complains of loud snoring and some sleep disorder. Past Medical History:  Diagnosis Date  . Anxiety   . COPD (chronic obstructive pulmonary disease) (HCC)   . Depression   . Hypertension     Past Surgical History:  Procedure Laterality Date  . Femur bone graft  2011   [place]-- Dr Mickle Plumb at [facility]  . HIP ARTHROSCOPY  [id number]   right hip  . JOINT REPLACEMENT  2009   Right THR-- [name]    Family History  Problem Relation Age of Onset  . Heart disease Father   . Birth defects  Other   . Heart disease Other   . Diabetes Other   . Hypertension Other     Social History Social History   Tobacco Use  . Smoking status: Current Every Day Smoker    Packs/day: 1.00    Types: Cigarettes  . Smokeless tobacco: Never Used  Substance Use Topics  . Alcohol use: No    Frequency: Never  . Drug use: No    Comment: heroin    Current Outpatient Medications  Medication Sig Dispense Refill  . acetaminophen (TYLENOL) 500 MG tablet Take 500 mg by mouth every 6 (six) hours as needed for moderate pain.    Marland Kitchen albuterol (PROVENTIL HFA;VENTOLIN HFA) 108 (90 BASE) MCG/ACT inhaler Inhale 2 puffs into the lungs every 6 (six) hours as needed for wheezing or shortness of breath. 18 g 1  . chlorthalidone (HYGROTON) 25 MG tablet Take 1 tablet (25 mg total) by mouth daily. 90 tablet 3  . EPINEPHrine (EPIPEN) 0.3 mg/0.3 mL IJ SOAJ injection Inject 0.3 mLs (0.3 mg total) into the muscle as needed. 1 Device 1  . gabapentin (NEURONTIN) 300 MG capsule Take 300-1,200 mg by mouth 4 (four) times daily. Take 1 capsule (300 mg) TID and Take 4 capsules (1200 mg) at bedtime.    Marland Kitchen ibuprofen (ADVIL,MOTRIN) 800 MG tablet Take 800 mg by mouth 3 (three) times daily as needed for pain.     . meloxicam (MOBIC) 15 MG tablet Take 1 tablet (15 mg  total) by mouth daily. 20 tablet 0   No current facility-administered medications for this visit.     Allergies  Allergen Reactions  . Bee Venom Anaphylaxis  . Fluoxetine Hcl     REACTION: paranoia  . Sertraline Hcl     REACTION: didn`t tolerate    Review of Systems                    Review of Systems :  [ y ] = yes, [  ] = no        General :  Weight gain [  y 20 pounds]    Weight loss  [   ]  Fatigue [ y ]  Fever [  ]  Chills  [  ]                                          HEENT    Headache [  ]  Dizziness [  ]  Blurred vision [  ] Glaucoma  [  ]                          Nosebleeds [  ] Painful or loose teeth [  ]        Cardiac :  Chest pain/  pressure [ y ]  Resting SOB [  ] exertional SOB [  ]                        Orthopnea [  ]  Pedal edema  [  ]  Palpitations [  ] Syncope/presyncope                         Paroxysmal nocturnal dyspnea [  ]         Pulmonary : cough [  ]  wheezing [  ]  Hemoptysis [  ] Sputum [  ] Snoring [ y ]                              Pneumothorax [  ]  Sleep apnea [  ]        GI : Vomiting [  ]  Dysphagia [  ]  Melena  [  ]  Abdominal pain [  ] BRBPR [  ]              Heart burn [  ]  Constipation [  ] Diarrhea  [  ] Colonoscopy [   ]        GU : Hematuria [  ]  Dysuria [  ]  Nocturia [  ] UTI's [  ]        Vascular : Claudication [  ]  Rest pain [  ]  DVT [  ] Vein stripping [  ] leg ulcers [  ]                          TIA [  ] Stroke [  ]  Varicose veins [  ]        NEURO :  Headaches  [  ] Seizures [  ] Vision changes [  ] Paresthesias [  ]  right-hand-dominant                                           Musculoskeletal :  Arthritis [ y ] Gout  [  ]  Back pain Cove.Etienne[y  ]  Joint pain [ y ]        Skin :  Rash [  ]  Melanoma [  ] Sores [  ]        Heme : Bleeding problems [  ]Clotting Disorders [  ] Anemia [  ]Blood Transfusion [ ]         Endocrine : Diabetes [  ] Heat or Cold intolerance [  ] Polyuria [  ]excessive thirst [ ]         Psych : Depression [ y ]  Anxiety [  ]  Psych hospitalizations [  ] Memory change [  ]                                                                            BP 124/68 (BP Location: Right Arm, Patient Position: Sitting, Cuff Size: Large)   Pulse 68   Temp 98.1 F (36.7 C) (Temporal)   Resp 16   Ht 5\' 9"  (1.753 m)   Wt 215 lb (97.5 kg)   SpO2 96% Comment: RA  BMI 31.75 kg/m  Physical Exam      Exam    General- alert and comfortable    Neck- no JVD, no cervical adenopathy palpable, no carotid bruit   Lungs- clear without rales, wheezes   Cor- regular rate and rhythm, no murmur , gallop   Abdomen- soft, non-tender   Extremities - warm,  non-tender, minimal edema   Neuro- oriented, appropriate, no focal weakness   Diagnostic Tests: CTA images pressure reviewed showing a mildly dilated ascending mid aortic diameter 4.6 cm.  No evidence of calcification or mural hematoma.  Echocardiogram shows normal LV function normal aortic valve function.  Impression: Asymptomatic mild fusiform ascending aneurysm 4.6 cm of unknown duration.  Patient has hypertension and smokes.  He understands the importance of smoking cessation and good blood pressure control to prevent further dilatation of the aorta.  He understands that the risk of aortic tear-dissection is almost 0 at this point and he does not need surgery.  Plan: Best therapy for this individual is annual surveillance scanning of his thoracic aorta, smoking cessation, and blood pressure monitoring and control.  I will see him back in 1 year with a CT scan which my office will arrange.  Mikey BussingPeter Van Trigt III, MD [facility] [phone]

## 2018-06-16 ENCOUNTER — Other Ambulatory Visit: Payer: Self-pay | Admitting: Internal Medicine

## 2018-10-28 ENCOUNTER — Ambulatory Visit (INDEPENDENT_AMBULATORY_CARE_PROVIDER_SITE_OTHER): Payer: Medicare Other | Admitting: Internal Medicine

## 2018-10-28 ENCOUNTER — Encounter: Payer: Self-pay | Admitting: Internal Medicine

## 2018-10-28 ENCOUNTER — Other Ambulatory Visit: Payer: Self-pay

## 2018-10-28 VITALS — BP 120/88 | HR 63 | Temp 97.8°F | Ht 69.0 in | Wt 239.0 lb

## 2018-10-28 DIAGNOSIS — L259 Unspecified contact dermatitis, unspecified cause: Secondary | ICD-10-CM | POA: Insufficient documentation

## 2018-10-28 DIAGNOSIS — L249 Irritant contact dermatitis, unspecified cause: Secondary | ICD-10-CM | POA: Diagnosis not present

## 2018-10-28 DIAGNOSIS — Z23 Encounter for immunization: Secondary | ICD-10-CM

## 2018-10-28 MED ORDER — EPINEPHRINE 0.3 MG/0.3ML IJ SOAJ: 0.3000 mg | INTRAMUSCULAR | 1 refills | Status: AC | PRN

## 2018-10-28 MED ORDER — PREDNISONE 20 MG PO TABS: 40.0000 mg | ORAL_TABLET | Freq: Every day | ORAL | 0 refills | Status: DC

## 2018-10-28 MED ORDER — TRIAMCINOLONE ACETONIDE 0.1 % EX CREA: 1.0000 "application " | TOPICAL_CREAM | Freq: Two times a day (BID) | CUTANEOUS | 1 refills | Status: DC | PRN

## 2018-10-28 NOTE — Progress Notes (Signed)
Subjective:    Patient ID: [name], male    DOB: 17-Apr-1975, 43 y.o.   MRN: [id number]  HPI Here due to rash  Started about 2 weeks ago Doesn't remember any exposure--does do yard work but nothing that would explain this Brother had similar rash on his arms also  Started with a little spot then spread Very itchy  Used benedryl and allerest Alcohol on it--nothing helping  Current Outpatient Medications on File Prior to Visit  Medication Sig Dispense Refill  . acetaminophen (TYLENOL) 500 MG tablet Take 500 mg by mouth every 6 (six) hours as needed for moderate pain.    Marland Kitchen albuterol (PROVENTIL HFA;VENTOLIN HFA) 108 (90 BASE) MCG/ACT inhaler Inhale 2 puffs into the lungs every 6 (six) hours as needed for wheezing or shortness of breath. 18 g 1  . chlorthalidone (HYGROTON) 25 MG tablet Take 1 tablet (25 mg total) by mouth daily. 90 tablet 3  . EPINEPHrine (EPIPEN) 0.3 mg/0.3 mL IJ SOAJ injection Inject 0.3 mLs (0.3 mg total) into the muscle as needed. 1 Device 1  . gabapentin (NEURONTIN) 300 MG capsule Take 300-1,200 mg by mouth 4 (four) times daily. Take 1 capsule (300 mg) TID and Take 4 capsules (1200 mg) at bedtime.    Marland Kitchen ibuprofen (ADVIL,MOTRIN) 800 MG tablet Take 800 mg by mouth 3 (three) times daily as needed for pain.      No current facility-administered medications on file prior to visit.     Allergies  Allergen Reactions  . Bee Venom Anaphylaxis  . Fluoxetine Hcl     REACTION: paranoia  . Sertraline Hcl     REACTION: didn`t tolerate    Past Medical History:  Diagnosis Date  . Anxiety   . COPD (chronic obstructive pulmonary disease) ([place])   . Depression   . Hypertension     Past Surgical History:  Procedure Laterality Date  . Femur bone graft  2011   Cadaver-- Dr Stephanie Acre at [place]  [id number]   right hip  . JOINT REPLACEMENT  2009   Right THR-- [name]    Family History  Problem Relation Age of Onset  . Heart disease Father   .  Birth defects Other   . Heart disease Other   . Diabetes Other   . Hypertension Other     Social History   Socioeconomic History  . Marital status: Divorced    Spouse name: Not on file  . Number of children: 2  . Years of education: Not on file  . Highest education level: Not on file  Occupational History  . Occupation: Disabled since 2012    Comment: from hip  Social Needs  . Financial resource strain: Not on file  . Food insecurity    Worry: Not on file    Inability: Not on file  . Transportation needs    Medical: Not on file    Non-medical: Not on file  Tobacco Use  . Smoking status: Current Every Day Smoker    Packs/day: 1.00    Types: Cigarettes  . Smokeless tobacco: Never Used  Substance and Sexual Activity  . Alcohol use: No    Frequency: Never  . 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 Sexual activity: Not on file  Lifestyle  . Physical activity    Days per week: Not on file    Minutes per session: Not on file  . Stress: Not on file  Relationships  .  Social Musician on phone: Not on file    Gets together: Not on file    Attends religious service: Not on file    Active member of club or organization: Not on file    Attends meetings of clubs or organizations: Not on file    Relationship status: Not on file  . Intimate partner violence    Fear of current or ex partner: Not on file    Emotionally abused: Not on file    Physically abused: Not on file    Forced sexual activity: Not on file  Other Topics Concern  . Not on file  Social History Narrative   Daughter and son from different relationships   Review of Systems  No fever Not sick No oral lesions     Objective:   Physical Exam  Constitutional: He appears well-developed. No distress.  Skin:  Fairly significant papulovesicular rash on extensor forearms and bilateral back           Assessment & Plan:

## 2018-10-28 NOTE — Addendum Note (Signed)
Addended by: Pilar Grammes on: 10/28/2018 02:00 PM   Modules accepted: Orders

## 2018-10-28 NOTE — Assessment & Plan Note (Signed)
Unclear source but classic appearance Still fairly severe Will give 10 days of prednisone Cream loratadine

## 2018-11-15 ENCOUNTER — Other Ambulatory Visit: Payer: Self-pay | Admitting: Internal Medicine

## 2018-11-16 NOTE — Telephone Encounter (Signed)
Pt left v/m requesting cb with status of refill for prednisone and cream.

## 2018-11-16 NOTE — Telephone Encounter (Signed)
Can not just refill Prednisone, pt needs to be reevaluated or be ok'd by Dr Silvio Pate

## 2019-03-06 ENCOUNTER — Other Ambulatory Visit: Payer: Self-pay | Admitting: *Deleted

## 2019-03-06 DIAGNOSIS — I712 Thoracic aortic aneurysm, without rupture, unspecified: Secondary | ICD-10-CM

## 2019-04-13 ENCOUNTER — Encounter: Payer: Self-pay | Admitting: Family Medicine

## 2019-04-13 ENCOUNTER — Other Ambulatory Visit: Payer: Self-pay

## 2019-04-13 ENCOUNTER — Ambulatory Visit (INDEPENDENT_AMBULATORY_CARE_PROVIDER_SITE_OTHER): Payer: Medicare Other | Admitting: Family Medicine

## 2019-04-13 VITALS — BP 126/90 | HR 64 | Temp 98.4°F | Ht 69.0 in | Wt 246.0 lb

## 2019-04-13 DIAGNOSIS — M549 Dorsalgia, unspecified: Secondary | ICD-10-CM

## 2019-04-13 MED ORDER — PREDNISONE 20 MG PO TABS: ORAL_TABLET | ORAL | 0 refills | Status: DC

## 2019-04-13 MED ORDER — TIZANIDINE HCL 4 MG PO TABS: ORAL_TABLET | ORAL | 1 refills | Status: DC

## 2019-04-13 NOTE — Progress Notes (Signed)
[name] T. Henry Rickett, MD [facility] [name], [address] Phone: [phone]  FAX: [phone]  [name] - 44 y.o. male  MRN [id number]  Date of Birth: Feb 03, 1975  Visit Date: 04/13/2019  PCP: Venia Carbon, MD  Referred by: Venia Carbon, MD  Chief Complaint  Patient presents with  . Back Pain    x 2 weeks    This visit occurred during the SARS-CoV-2 public health emergency.  Safety protocols were in place, including screening questions prior to the visit, additional usage of staff PPE, and extensive cleaning of exam room while observing appropriate contact time as indicated for disinfecting solutions.   Subjective:   [name] is a 44 y.o. very pleasant male patient who presents with the following: Back Pain  ongoing for approximately: 2 weeks The patient has had back pain before. The back pain is localized into the lumbar spine area. They also describe no radiculopathy.  No numbness or tingling. No bowel or bladder incontinence. No focal weakness.  Over-the-counter remedies have not caused any significant relief of symptoms  prior interventions: None Physical therapy: No Chiropractic manipulations: No Acupuncture: No Osteopathic manipulation: No Heat or cold: Minimal effect   Review of Systems is noted in the HPI, as appropriate  Objective:   Blood pressure 126/90, pulse 64, temperature 98.4 F (36.9 C), temperature source Temporal, height '5\' 9"'$  (1.753 m), weight 246 lb (111.6 kg).  GEN: No acute distress; alert,appropriate. PULM: Breathing comfortably in no respiratory distress PSYCH: Normally interactive.   Range of motion at  the waist: Flexion, rotation and lateral bending: Only modest restriction.  No echymosis or edema Rises to examination table with no difficulty Gait: minimally antalgic  Inspection/Deformity: No abnormality Paraspinus T:  L4-S1  B Ankle Dorsiflexion (L5,4): 5/5 B Great Toe Dorsiflexion (L5,4): 5/5 Heel Walk (L5): WNL Toe Walk (S1): WNL Rise/Squat (L4): WNL, mild pain  SENSORY B Medial Foot (L4): WNL B Dorsum (L5): WNL B Lateral (S1): WNL Light Touch: WNL Pinprick: WNL  REFLEXES Knee (L4): 2+ Ankle (S1): 2+  B SLR, seated: neg B SLR, supine: neg B FABER: neg B Reverse FABER: neg B Greater Troch: NT B Log Roll: neg B Stork: NT B Sciatic Notch: NT  Radiology: No results found.  Assessment and Plan:     ICD-10-CM   1. Acute back pain, unspecified back location, unspecified back pain laterality  M54.9    Failure of over-the-counter treatment.  Convert to prednisone.  Muscle relaxant at nighttime.  Range of motion, warmth, heating pad, warm shower.  Massage if possible.  Level of Medical Decision-Making in this case is MODERATE.  Anatomy reviewed. Conservative algorithms for acute back pain generally begin with the following: NSAIDS, Muscle Relaxants, Mild pain medication Start with medications, core rehab, and progress from there following low back pain algorithm. No red flags are present.  Follow-up: No follow-ups on file.  Meds ordered this encounter  Medications  . predniSONE (DELTASONE) 20 MG tablet    Sig: 2 tabs po for 7 days, then 1 tab po for 7 days    Dispense:  21 tablet    Refill:  0  . tiZANidine (ZANAFLEX) 4 MG tablet    Sig: 1 tab po qhs prn muscle spasms    Dispense:  30 tablet    Refill:  1   Medications Discontinued During This Encounter  Medication Reason  .  predniSONE (DELTASONE) 20 MG tablet Completed Course  . triamcinolone cream (KENALOG) 0.1 % Completed Course   No orders of the defined types were placed in this encounter.   Signed,  Maud Deed. Jaxxen Voong, MD   Outpatient Encounter Medications as of 04/13/2019  Medication Sig  . acetaminophen (TYLENOL) 500 MG tablet Take 500 mg by mouth every 6 (six) hours as needed for moderate pain.  Marland Kitchen albuterol  (PROVENTIL HFA;VENTOLIN HFA) 108 (90 BASE) MCG/ACT inhaler Inhale 2 puffs into the lungs every 6 (six) hours as needed for wheezing or shortness of breath.  . buprenorphine (SUBUTEX) 8 MG SUBL SL tablet Place 8 mg under the tongue 3 (three) times daily.  . chlorthalidone (HYGROTON) 25 MG tablet Take 1 tablet (25 mg total) by mouth daily.  Marland Kitchen EPINEPHrine (EPIPEN) 0.3 mg/0.3 mL IJ SOAJ injection Inject 0.3 mLs (0.3 mg total) into the muscle as needed.  . gabapentin (NEURONTIN) 300 MG capsule Take 300-1,200 mg by mouth 4 (four) times daily. Take 1 capsule (300 mg) TID and Take 4 capsules (1200 mg) at bedtime.  Marland Kitchen ibuprofen (ADVIL,MOTRIN) 800 MG tablet Take 800 mg by mouth 3 (three) times daily as needed for pain.   Marland Kitchen NARCAN 4 MG/0.1ML LIQD nasal spray kit ONE SPRAY IN SINGLE NOSTRIL     MAY REPEAT EVERY 2 TO 3 MINUTES OR UNTIL MEDICAL ASSISTANCE ARRIVES  . predniSONE (DELTASONE) 20 MG tablet 2 tabs po for 7 days, then 1 tab po for 7 days  . tiZANidine (ZANAFLEX) 4 MG tablet 1 tab po qhs prn muscle spasms  . [DISCONTINUED] predniSONE (DELTASONE) 20 MG tablet Take 2 tablets (40 mg total) by mouth daily. For 5 days, then 1 tab daily for 5 days  . [DISCONTINUED] triamcinolone cream (KENALOG) 0.1 % APPLY TO AFFECTED AREA TWICE A DAY AS NEEDED   No facility-administered encounter medications on file as of 04/13/2019.

## 2019-04-20 ENCOUNTER — Ambulatory Visit: Payer: Medicare Other | Attending: Internal Medicine

## 2019-04-20 DIAGNOSIS — Z23 Encounter for immunization: Secondary | ICD-10-CM

## 2019-04-20 NOTE — Progress Notes (Signed)
   Covid-19 Vaccination Clinic  Name:  [name]    MRN: [id number] DOB: March 07, 1975  04/20/2019  [name] was observed post Covid-19 immunization for 15 minutes without incident. He was provided with Vaccine Information Sheet and instruction to access the V-Safe system.   Mr. Sales was instructed to call 911 with any severe reactions post vaccine: Marland Kitchen Difficulty breathing  . Swelling of face and throat  . A fast heartbeat  . A bad rash all over body  . Dizziness and weakness   Immunizations Administered    Name Date Dose VIS Date Route   Pfizer COVID-19 Vaccine 04/20/2019  9:17 AM 0.3 mL 01/13/2019 Intramuscular   Manufacturer: [employer], [employer]   Lot: [id number]   NDC: [address]-6

## 2019-05-03 ENCOUNTER — Other Ambulatory Visit: Payer: Self-pay | Admitting: Cardiothoracic Surgery

## 2019-05-03 ENCOUNTER — Ambulatory Visit
Admission: RE | Admit: 2019-05-03 | Discharge: 2019-05-03 | Disposition: A | Discharge status: Home / Self Care | Payer: Medicare Other | Source: Ambulatory Visit | Attending: Cardiothoracic Surgery | Admitting: Cardiothoracic Surgery

## 2019-05-03 ENCOUNTER — Encounter: Payer: Self-pay | Admitting: Cardiothoracic Surgery

## 2019-05-03 ENCOUNTER — Ambulatory Visit (INDEPENDENT_AMBULATORY_CARE_PROVIDER_SITE_OTHER): Payer: Medicare Other | Admitting: Cardiothoracic Surgery

## 2019-05-03 ENCOUNTER — Other Ambulatory Visit: Payer: Self-pay

## 2019-05-03 ENCOUNTER — Other Ambulatory Visit: Payer: Self-pay | Admitting: *Deleted

## 2019-05-03 VITALS — BP 154/99 | HR 76 | Temp 97.7°F | Resp 16 | Ht 69.0 in | Wt 240.0 lb

## 2019-05-03 DIAGNOSIS — I712 Thoracic aortic aneurysm, without rupture, unspecified: Secondary | ICD-10-CM

## 2019-05-03 MED ORDER — IOPAMIDOL (ISOVUE-370) INJECTION 76%: 75.0000 mL | Freq: Once | INTRAVENOUS | Status: DC | PRN

## 2019-05-03 NOTE — Progress Notes (Signed)
PCP is Venia Carbon, MD Referring Provider is Jerline Pain, MD  Chief Complaint  Patient presents with  . 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  He has history of mild hypertension on medication.  No family history of thoracic or abdominal aortic aneurysm disease.  He denies chest pain.  He is smoking 1/4 to 1/2 pack/day he states he does check his blood pressure at home.  Last year his ascending aneurysm was mild at 4.6 cm.  I reviewed the images performed on scan today and there is been no change.  There is no evidence of hematoma or penetrating ulceration of the aortic wall.  Final report on lungs and mediastinum are pending.  The patient understands that at its current size the fusiform dilatation of ascending aorta represents a very low risk for dissection-tear. Past Medical History:  Diagnosis Date  . Anxiety   . COPD (chronic obstructive pulmonary disease) ([place])   . Depression   . Hypertension     Past Surgical History:  Procedure Laterality Date  . Femur bone graft  2011   Cadaver-- Dr Stephanie Acre at [place] [facility]  [id number]   right hip  . JOINT REPLACEMENT  2009   Right THR-- [name]    Family History  Problem Relation Age of Onset  . Heart disease Father   . Birth defects Other   . Heart disease Other   . Diabetes Other   . Hypertension Other     Social History Social History   Tobacco Use  . Smoking status: Current Every Day Smoker    Packs/day: 1.00    Types: Cigarettes  . Smokeless tobacco: Never Used  Substance Use Topics  . Alcohol use: No  . 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 acetaminophen (TYLENOL) 500 MG tablet Take 500 mg by mouth every 6 (six) hours as needed for moderate pain.    Marland Kitchen albuterol (PROVENTIL HFA;VENTOLIN HFA) 108 (90 BASE) MCG/ACT inhaler Inhale 2 puffs into the lungs every 6 (six) hours as needed for wheezing or shortness of breath. 18 g 1  . buprenorphine (SUBUTEX) 8 MG SUBL SL tablet Place 8 mg under the tongue 3 (three) times daily.    . chlorthalidone (HYGROTON) 25 MG tablet Take 1 tablet (25 mg total) by mouth daily. 90 tablet 3  . EPINEPHrine (EPIPEN) 0.3 mg/0.3 mL IJ SOAJ injection Inject 0.3 mLs (0.3 mg total) into the muscle as needed. 2 each 1  . gabapentin (NEURONTIN) 300 MG capsule Take 300-1,200 mg by mouth 4 (four) times daily. Take 1 capsule (300 mg) TID and Take 4 capsules (1200 mg) at bedtime.    Marland Kitchen ibuprofen (ADVIL,MOTRIN) 800 MG tablet Take 800 mg by mouth 3 (three) times daily as needed for pain.     Marland Kitchen NARCAN 4 MG/0.1ML LIQD nasal spray kit ONE SPRAY IN SINGLE NOSTRIL     MAY REPEAT EVERY 2 TO 3 MINUTES OR UNTIL MEDICAL ASSISTANCE ARRIVES     No current facility-administered medications for this visit.   Facility-Administered Medications Ordered in Other  Visits  Medication Dose Route Frequency Provider Last Rate Last Admin  . iopamidol (ISOVUE-370) 76 % injection 75 mL  75 mL Intravenous Once PRN Ivin Poot, MD        Allergies  Allergen Reactions  . Bee Venom Anaphylaxis  . Fluoxetine Hcl     REACTION: paranoia  . Sertraline Hcl     REACTION: didn`t tolerate    Review of Systems   The patient is disabled trauma to his right hip and right femur with fractures and surgery.  He is on chronic pain medication.  He is disabled.  He wishes to be referred back to a pain clinic in [place] which treated him previously with injections.  The patient complains of severe fatigue poor sleeping and feeling unrested when he gets up in the morning.  He has a history of heavy snoring and his  father uses a CPAP.  He will be referred to Dr. Claiborne Billings for sleep study evaluation of OSA.  BP (!) 154/99 (BP Location: Right Arm, Patient Position: Sitting, Cuff Size: Large)   Pulse 76   Temp 97.7 F (36.5 C)   Resp 16   Ht _0  (1.753 m)   Wt 240 lb (108.9 kg)   SpO2 100% Comment: RA  BMI 35.44 kg/m  Physical Exam       Exam    General- alert and comfortable.  Obese middle-aged Caucasian male    Neck- no JVD, no cervical adenopathy palpable, no carotid bruit   Lungs- clear without rales, wheezes   Cor- regular rate and rhythm, no murmur , gallop   Abdomen- soft, non-tender   Extremities - warm, non-tender, minimal edema   Neuro- oriented, appropriate, no focal weakness   Diagnostic Tests: CT images personally reviewed showing no change in the mild-moderate fusiform ascending aneurysm 4.6 cm diameter.   Impression: Stable asymptomatic fusiform ascending aneurysm.  Patient understands best therapy  is prevention of further dilatation by smoking cessation, blood pressure control, and regular annual scans.   Plan: Patient return in 1 year with a CT scan of chest without contrast.  The patient counters significant problems get an IV started and the aortic diameter in this patient's situation can be measured without contrast.   Len Childs, MD [facility] [phone]

## 2019-05-15 ENCOUNTER — Ambulatory Visit: Payer: Medicare Other | Attending: Internal Medicine

## 2019-05-15 DIAGNOSIS — Z23 Encounter for immunization: Secondary | ICD-10-CM

## 2019-05-15 NOTE — Progress Notes (Signed)
   Covid-19 Vaccination Clinic  Name:  [name]    MRN: [id number] DOB: 08/04/1975  05/15/2019  [name] was observed post Covid-19 immunization for 15 minutes without incident. He was provided with Vaccine Information Sheet and instruction to access the V-Safe system.   [name] was instructed to call 911 with any severe reactions post vaccine: Marland Kitchen Difficulty breathing  . Swelling of face and throat  . A fast heartbeat  . A bad rash all over body  . Dizziness and weakness   Immunizations Administered    Name Date Dose VIS Date Route   Pfizer COVID-19 Vaccine 05/15/2019  8:59 AM 0.3 mL 01/13/2019 Intramuscular   Manufacturer: [employer], [employer]   Lot: [id number]   NDC: [address]-8

## 2019-05-24 ENCOUNTER — Other Ambulatory Visit: Payer: Self-pay | Admitting: Family Medicine

## 2019-06-05 DIAGNOSIS — M545 Low back pain: Secondary | ICD-10-CM | POA: Diagnosis not present

## 2019-06-05 DIAGNOSIS — G8929 Other chronic pain: Secondary | ICD-10-CM | POA: Diagnosis not present

## 2019-06-09 DIAGNOSIS — M545 Low back pain: Secondary | ICD-10-CM | POA: Diagnosis not present

## 2019-06-09 DIAGNOSIS — M5137 Other intervertebral disc degeneration, lumbosacral region: Secondary | ICD-10-CM | POA: Diagnosis not present

## 2019-06-09 DIAGNOSIS — G8929 Other chronic pain: Secondary | ICD-10-CM | POA: Diagnosis not present

## 2019-06-09 DIAGNOSIS — M47817 Spondylosis without myelopathy or radiculopathy, lumbosacral region: Secondary | ICD-10-CM | POA: Diagnosis not present

## 2019-07-04 DIAGNOSIS — M47816 Spondylosis without myelopathy or radiculopathy, lumbar region: Secondary | ICD-10-CM | POA: Diagnosis not present

## 2019-07-27 ENCOUNTER — Ambulatory Visit: Payer: Medicare Other | Admitting: Cardiovascular Disease

## 2019-09-05 DIAGNOSIS — M47816 Spondylosis without myelopathy or radiculopathy, lumbar region: Secondary | ICD-10-CM | POA: Diagnosis not present

## 2019-09-26 DIAGNOSIS — M533 Sacrococcygeal disorders, not elsewhere classified: Secondary | ICD-10-CM | POA: Diagnosis not present

## 2019-09-26 DIAGNOSIS — M5137 Other intervertebral disc degeneration, lumbosacral region: Secondary | ICD-10-CM | POA: Diagnosis not present

## 2019-09-26 DIAGNOSIS — G8929 Other chronic pain: Secondary | ICD-10-CM | POA: Diagnosis not present

## 2019-09-26 DIAGNOSIS — Z9889 Other specified postprocedural states: Secondary | ICD-10-CM | POA: Diagnosis not present

## 2019-09-26 DIAGNOSIS — M5441 Lumbago with sciatica, right side: Secondary | ICD-10-CM | POA: Diagnosis not present

## 2019-09-27 DIAGNOSIS — M533 Sacrococcygeal disorders, not elsewhere classified: Secondary | ICD-10-CM | POA: Diagnosis not present

## 2019-09-27 DIAGNOSIS — M7918 Myalgia, other site: Secondary | ICD-10-CM | POA: Diagnosis not present

## 2019-09-27 DIAGNOSIS — G8929 Other chronic pain: Secondary | ICD-10-CM | POA: Diagnosis not present

## 2019-09-27 DIAGNOSIS — M5441 Lumbago with sciatica, right side: Secondary | ICD-10-CM | POA: Diagnosis not present

## 2019-09-30 DIAGNOSIS — R0602 Shortness of breath: Secondary | ICD-10-CM | POA: Diagnosis not present

## 2019-09-30 DIAGNOSIS — I469 Cardiac arrest, cause unspecified: Secondary | ICD-10-CM | POA: Diagnosis not present

## 2019-09-30 DIAGNOSIS — T887XXA Unspecified adverse effect of drug or medicament, initial encounter: Secondary | ICD-10-CM | POA: Diagnosis not present

## 2019-09-30 DIAGNOSIS — R404 Transient alteration of awareness: Secondary | ICD-10-CM | POA: Diagnosis not present

## 2019-10-04 DIAGNOSIS — 419620001 Death: Secondary | SNOMED CT | POA: Diagnosis not present

## 2019-10-04 DEATH — deceased

## 2020-04-11 ENCOUNTER — Other Ambulatory Visit: Payer: Self-pay | Admitting: Cardiothoracic Surgery

## 2020-04-11 DIAGNOSIS — I712 Thoracic aortic aneurysm, without rupture, unspecified: Secondary | ICD-10-CM

## 2020-06-06 ENCOUNTER — Other Ambulatory Visit: Payer: Medicare Other

## 2020-06-06 ENCOUNTER — Ambulatory Visit: Payer: Medicare Other | Admitting: Cardiothoracic Surgery

## 2020-06-20 ENCOUNTER — Ambulatory Visit: Payer: Medicare Other | Admitting: Cardiothoracic Surgery

## 2021-07-30 IMAGING — CT CT CHEST W/O CM
1 series · 15 of 34 positions shown, 19 images · non-contrast
Comparison: Prior CTA chest 04/08/2018

CLINICAL DATA: 43-year-old male with a history of ascending
performed as IV access could not be established.

EXAM:
CT CHEST WITHOUT CONTRAST
TECHNIQUE: Multidetector CT imaging of the chest was performed following the
standard protocol without IV contrast.

[Series 2: chest w/(date) · axial · 0.85mm/px · z∈[-327,-63]mm · 15 of 156 slices shown, 19 images]
[im 12/156  mediastinal]
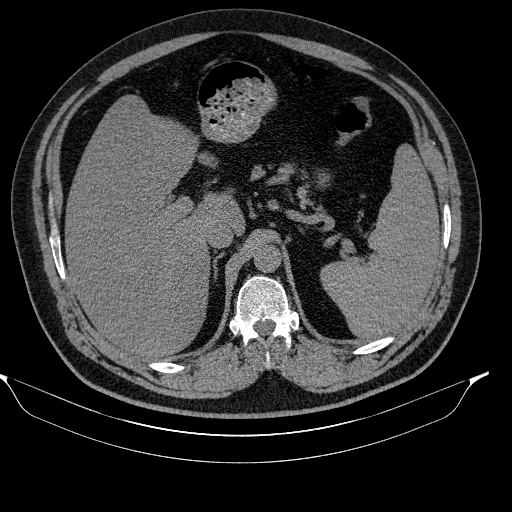
[im 12/156  lung]
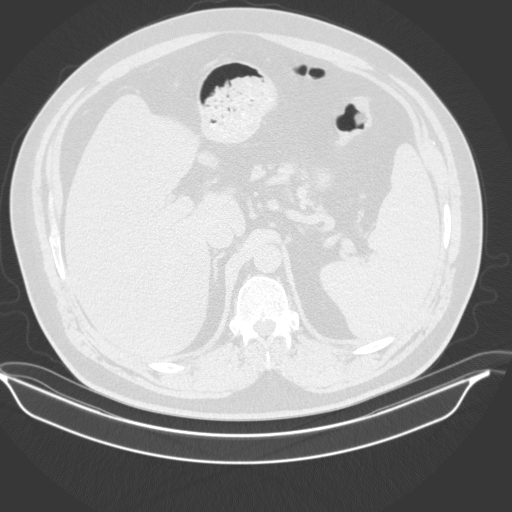
[im 23/156  lung]
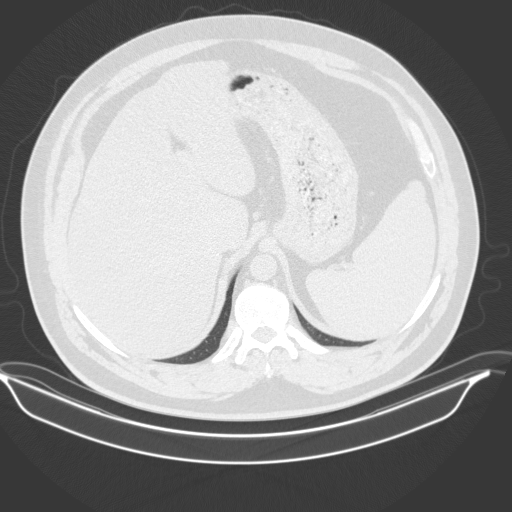
[im 32/156  lung]
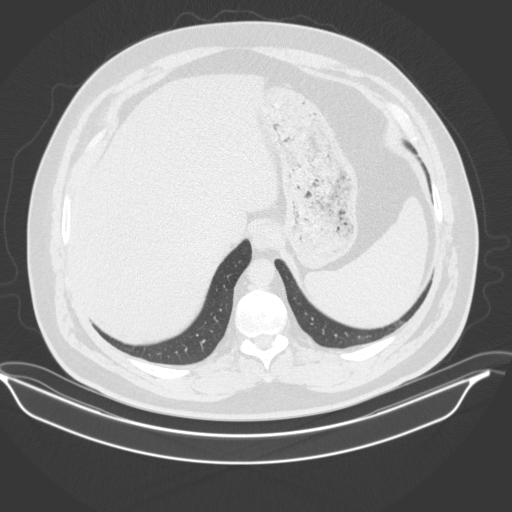
[im 41/156  lung]
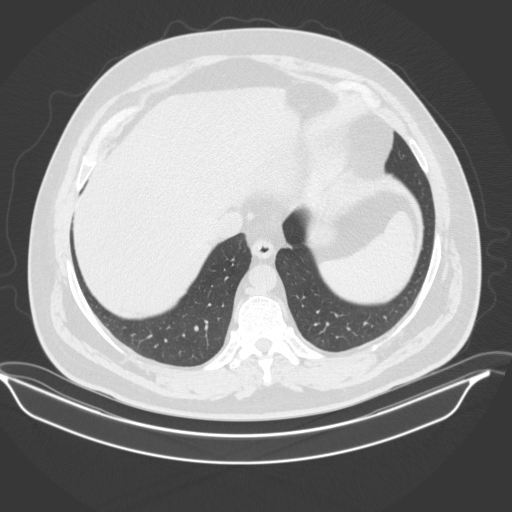
[im 52/156  mediastinal]
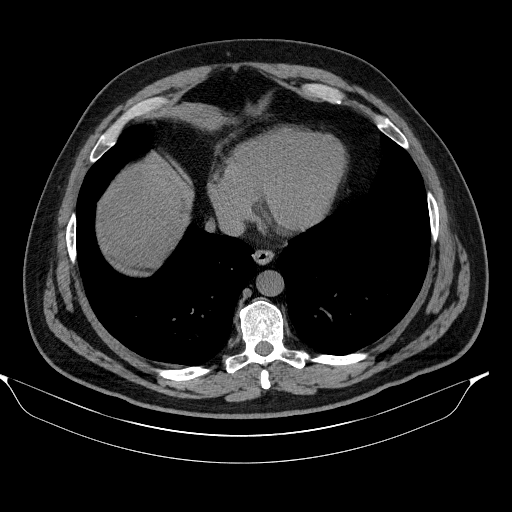
[im 52/156  lung]
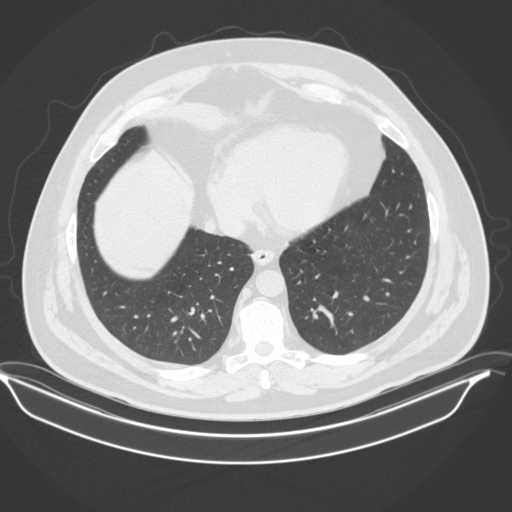
[im 63/156  lung]
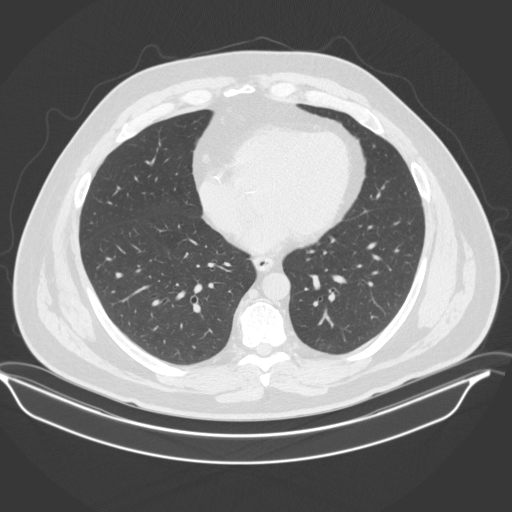
[im 69/156  lung]
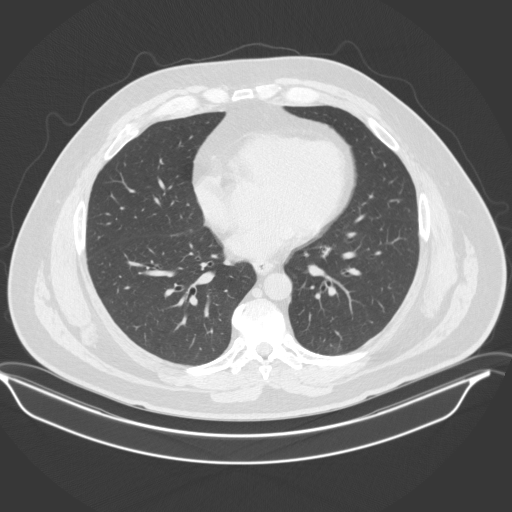
[im 81/156  lung]
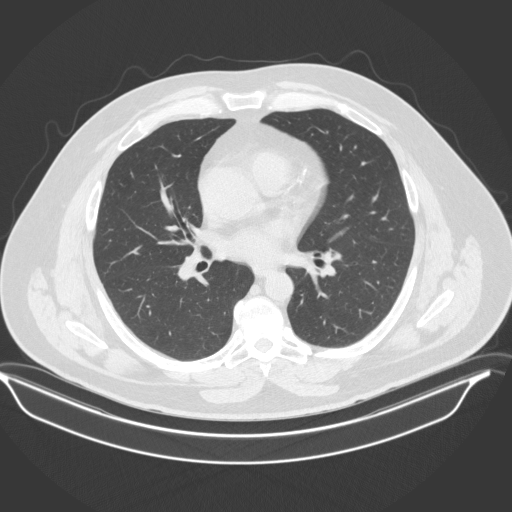
[im 87/156  mediastinal]
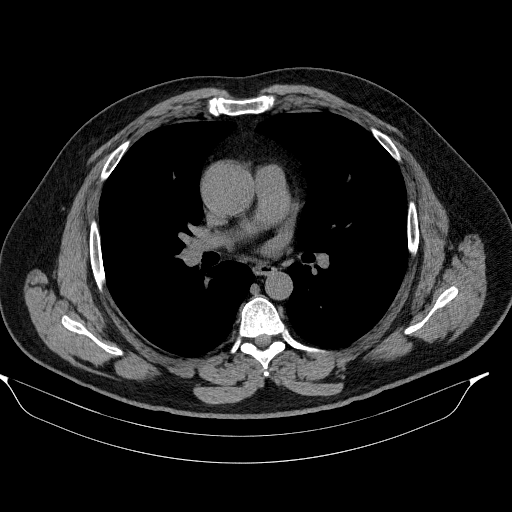
[im 87/156  lung]
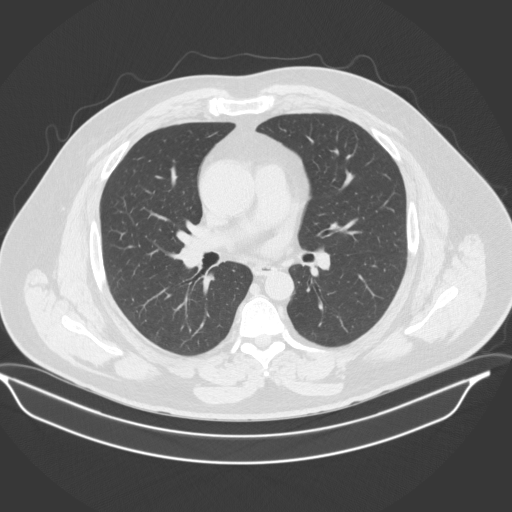
[im 94/156  lung]
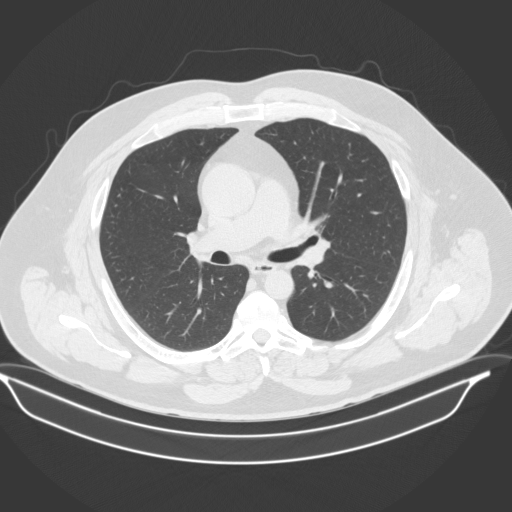
[im 104/156  lung]
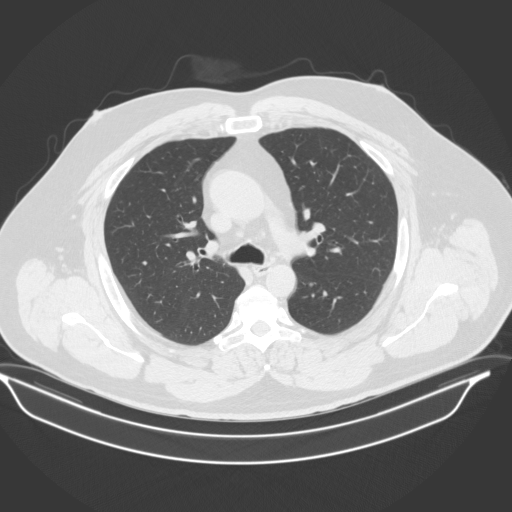
[im 115/156  lung]
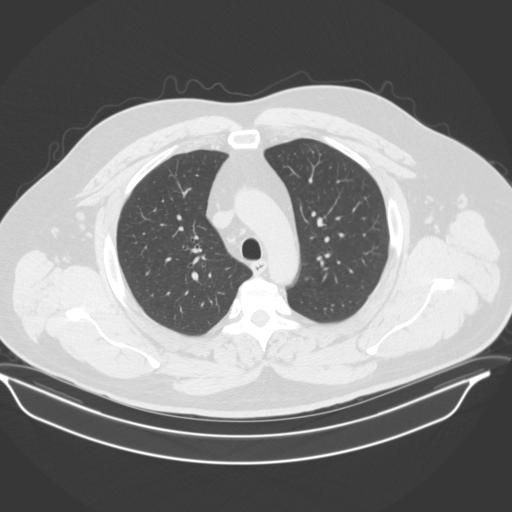
[im 125/156  mediastinal]
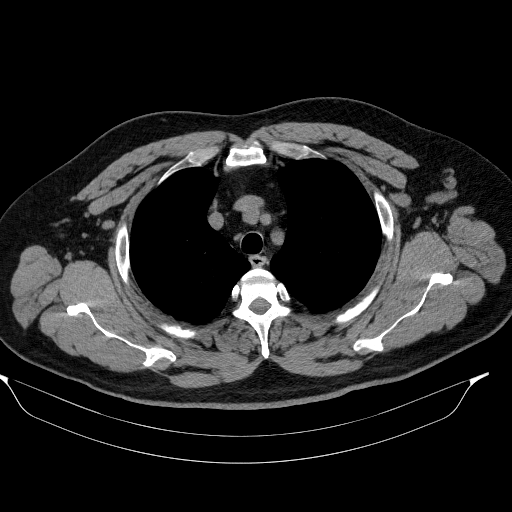
[im 125/156  lung]
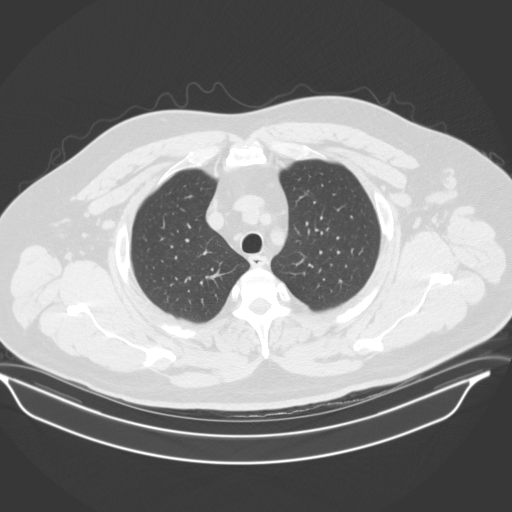
[im 133/156  lung]
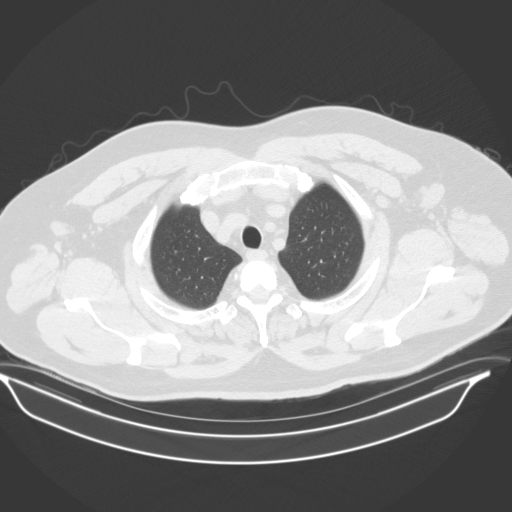
[im 144/156  lung]
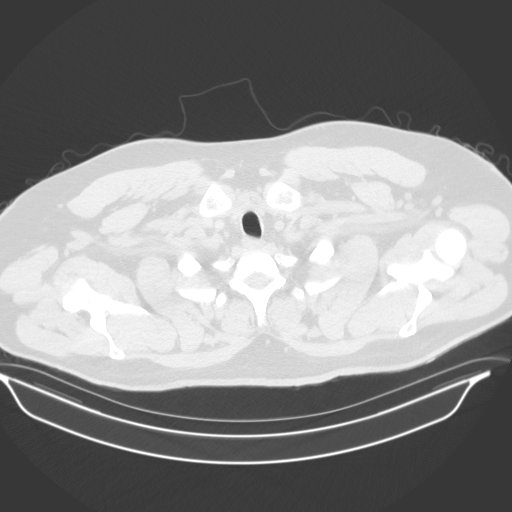

[15 of 34 positions shown; findings below may reference images not displayed]

FINDINGS: Cardiovascular: Fusiform aneurysmal dilation of the tubular portion
of the ascending thoracic aorta is again noted. The maximal aortic
diameter is 4.5 cm. This is slightly less than 4.7 cm documented on
the prior study. The prior measurements may have been slightly
exaggerated secondary to blurring of the aortic margins related to
cardiac motion artifact. Conventional 3 vessel arch anatomy. The
transverse and descending thoracic aorta are normal in caliber.
Calcifications are again noted scattered throughout the coronary
arteries. The heart is normal in size. There is no pericardial
effusion.

Mediastinum/Nodes: Unremarkable CT appearance of the thyroid gland.
No suspicious mediastinal or hilar adenopathy. No soft tissue
mediastinal mass. The thoracic esophagus is unremarkable.

Lungs/Pleura: The lungs are clear. There is an incidental calcified
subpleural granuloma in the periphery of the right middle lobe which
is of no consequence.

Upper Abdomen: Mild low attenuation of the hepatic parenchyma
suggests steatosis. There is sparing around the gallbladder fossa.
No acute abnormality visualized within the upper abdomen.

Musculoskeletal: No acute fracture or aggressive appearing lytic or
blastic osseous lesion.
IMPRESSION: 1. Fusiform aneurysmal dilation of the ascending thoracic aorta with
a maximal diameter of 4.5 cm. The prior measurement of 4.7 cm may
have been exaggerated by cardiac motion artifact. Ascending thoracic
aortic aneurysm. Recommend semi-annual imaging followup by CTA or
MRA and referral to cardiothoracic surgery if not already obtained.
This recommendation follows 8909
ACCF/AHA/AATS/ACR/ASA/SCA/NNIAMH/SABATELLI/HUNG/PRGEDRGARG Guidelines for the
Diagnosis and Management of Patients With Thoracic Aortic Disease.
Circulation. 8909; 121: E266-e369. Aortic aneurysm NOS (G16D3-BUT.D)
2. Coronary artery calcifications.
3. Hepatic steatosis.
